# Patient Record
Sex: Female | Born: 1965 | Race: White | Hispanic: No | Marital: Married | Smoking: Never smoker
Health system: Southern US, Community
[De-identification: ages and names within clinical notes are randomized; demographics above are authoritative.]

## PROBLEM LIST (undated history)

## (undated) DIAGNOSIS — E785 Hyperlipidemia, unspecified: Secondary | ICD-10-CM

## (undated) DIAGNOSIS — R51 Headache: Secondary | ICD-10-CM

## (undated) DIAGNOSIS — R519 Headache, unspecified: Secondary | ICD-10-CM

## (undated) DIAGNOSIS — U071 COVID-19: Secondary | ICD-10-CM

## (undated) DIAGNOSIS — E039 Hypothyroidism, unspecified: Secondary | ICD-10-CM

## (undated) HISTORY — PX: CHOLECYSTECTOMY: SHX55

## (undated) HISTORY — DX: Morbid (severe) obesity due to excess calories: E66.01

## (undated) HISTORY — DX: Hyperlipidemia, unspecified: E78.5

## (undated) HISTORY — PX: TUBAL LIGATION: SHX77

## (undated) HISTORY — DX: COVID-19: U07.1

---

## 2006-05-25 ENCOUNTER — Ambulatory Visit (HOSPITAL_COMMUNITY): Admission: RE | Admit: 2006-05-25 | Discharge: 2006-05-25 | Payer: Self-pay | Admitting: Family Medicine

## 2017-05-25 ENCOUNTER — Encounter: Payer: Self-pay | Admitting: Medical

## 2017-08-05 ENCOUNTER — Other Ambulatory Visit: Payer: Self-pay | Admitting: Orthopedic Surgery

## 2017-08-10 NOTE — Pre-Procedure Instructions (Signed)
Loyal JacobsonGail D Dassow  08/10/2017      [facility] [address] - [place], [place] - [id number] [place] [id number] Phone: [phone] Fax: [phone]    Your procedure is scheduled on August 13, 2017.  [facility] at 530 AM.  Call this number if you have problems the morning of surgery:  [phone]   Remember:  Do not eat food or drink liquids after midnight.  Take these medicines the morning of surgery with A SIP OF WATER nadolol (corgard), levothyroxine (synthroid).   STOP taking any Aspirin (unless otherwise instructed by your surgeon), Aleve, Naproxen, Ibuprofen, Motrin, Advil, Goody's, BC's, all herbal medications, fish oil, and all vitamins  Continue all other medications as instructed by your physician except follow the above medication instructions before surgery   Do not wear jewelry, make-up or nail polish.  Do not wear lotions, powders, or perfumes, or deoderant.  Do not shave 48 hours prior to surgery.   Do not bring valuables to the hospital.  [facility] is not responsible for any belongings or valuables.  Contacts, dentures or bridgework may not be worn into surgery.  Leave your suitcase in the car.  After surgery it may be brought to your room.  For patients admitted to the hospital, discharge time will be determined by your treatment team.  Patients discharged the day of surgery will not be allowed to drive home.   Special instructions:  [facility]- Preparing For Surgery  Before surgery, you can play an important role. Because skin is not sterile, your skin needs to be as free of germs as possible. You can reduce the number of germs on your skin by washing with CHG (chlorahexidine gluconate) Soap before surgery.  CHG is an antiseptic cleaner which kills germs and bonds with the skin to continue killing germs even after washing.  Please do not use if you have an allergy to CHG or antibacterial soaps.  If your skin becomes reddened/irritated stop using the CHG.  Do not shave (including legs and underarms) for at least 48 hours prior to first CHG shower. It is OK to shave your face.  Please follow these instructions carefully.   1. Shower the NIGHT BEFORE SURGERY and the MORNING OF SURGERY with CHG.   2. If you chose to wash your hair, wash your hair first as usual with your normal shampoo.  3. After you shampoo, rinse your hair and body thoroughly to remove the shampoo.  4. Use CHG as you would any other liquid soap. You can apply CHG directly to the skin and wash gently with a scrungie or a clean washcloth.   5. Apply the CHG Soap to your body ONLY FROM THE NECK DOWN.  Do not use on open wounds or open sores. Avoid contact with your eyes, ears, mouth and genitals (private parts). Wash Face and genitals (private parts)  with your normal soap.  6. Wash thoroughly, paying special attention to the area where your surgery will be performed.  7. Thoroughly rinse your body with warm water from the neck down.  8. DO NOT shower/wash with your normal soap after using and rinsing off the CHG Soap.  9. Pat yourself dry with a CLEAN TOWEL.  10. Wear CLEAN PAJAMAS to bed the night before surgery, wear comfortable clothes the morning of surgery  11. Place CLEAN SHEETS on your bed the night of your first shower and DO NOT SLEEP  WITH PETS.    Day of Surgery: Do not apply any deodorants/lotions. Please wear clean clothes to the hospital/surgery center.     Please read over the following fact sheets that you were given. Pain Booklet, Coughing and Deep Breathing and Surgical Site Infection Prevention

## 2017-08-11 ENCOUNTER — Encounter (HOSPITAL_COMMUNITY)
Admission: RE | Admit: 2017-08-11 | Discharge: 2017-08-11 | Disposition: A | Discharge status: Home / Self Care | Payer: No Typology Code available for payment source | Source: Ambulatory Visit | Attending: Orthopedic Surgery | Admitting: Orthopedic Surgery

## 2017-08-11 ENCOUNTER — Encounter (HOSPITAL_COMMUNITY): Payer: Self-pay

## 2017-08-11 ENCOUNTER — Other Ambulatory Visit: Payer: Self-pay

## 2017-08-11 DIAGNOSIS — M23222 Derangement of posterior horn of medial meniscus due to old tear or injury, left knee: Secondary | ICD-10-CM | POA: Diagnosis not present

## 2017-08-11 DIAGNOSIS — E039 Hypothyroidism, unspecified: Secondary | ICD-10-CM | POA: Diagnosis not present

## 2017-08-11 DIAGNOSIS — M94262 Chondromalacia, left knee: Secondary | ICD-10-CM | POA: Diagnosis not present

## 2017-08-11 HISTORY — DX: Headache: R51

## 2017-08-11 HISTORY — DX: Headache, unspecified: R51.9

## 2017-08-11 HISTORY — DX: Hypothyroidism, unspecified: E03.9

## 2017-08-11 LAB — CBC
HCT: 42.1 % (ref 36.0–46.0)
Hemoglobin: 14.1 g/dL (ref 12.0–15.0)
MCH: 29.3 pg (ref 26.0–34.0)
MCHC: 33.5 g/dL (ref 30.0–36.0)
MCV: 87.5 fL (ref 78.0–100.0)
PLATELETS: 316 10*3/uL (ref 150–400)
RBC: 4.81 MIL/uL (ref 3.87–5.11)
RDW: 13.4 % (ref 11.5–15.5)
WBC: 7.2 10*3/uL (ref 4.0–10.5)

## 2017-08-11 LAB — BASIC METABOLIC PANEL
ANION GAP: 5 (ref 5–15)
BUN: 15 mg/dL (ref 6–20)
CALCIUM: 9.2 mg/dL (ref 8.9–10.3)
CO2: 26 mmol/L (ref 22–32)
Chloride: 108 mmol/L (ref 101–111)
Creatinine, Ser: 1.06 mg/dL — ABNORMAL HIGH (ref 0.44–1.00)
GFR, EST NON AFRICAN AMERICAN: 60 mL/min — AB (ref >60)
GLUCOSE: 96 mg/dL (ref 65–99)
POTASSIUM: 5 mmol/L (ref 3.5–5.1)
SODIUM: 139 mmol/L (ref 135–145)

## 2017-08-12 MED ORDER — CEFAZOLIN SODIUM-DEXTROSE 2-4 GM/100ML-% IV SOLN
2.0000 g | INTRAVENOUS | Status: AC
Start: 2017-08-13 — End: 2017-08-13
  Administered 2017-08-13: 2 g via INTRAVENOUS
  Filled 2017-08-12: qty 100

## 2017-08-13 ENCOUNTER — Encounter (HOSPITAL_COMMUNITY)
Admission: RE | Disposition: A | Discharge status: Home / Self Care | Payer: Self-pay | Source: Ambulatory Visit | Attending: Orthopedic Surgery

## 2017-08-13 ENCOUNTER — Ambulatory Visit (HOSPITAL_COMMUNITY): Payer: No Typology Code available for payment source | Admitting: Certified Registered"

## 2017-08-13 ENCOUNTER — Encounter (HOSPITAL_COMMUNITY): Payer: Self-pay | Admitting: *Deleted

## 2017-08-13 ENCOUNTER — Ambulatory Visit (HOSPITAL_COMMUNITY)
Admission: RE | Admit: 2017-08-13 | Discharge: 2017-08-13 | Disposition: A | Discharge status: Home / Self Care | Payer: No Typology Code available for payment source | Source: Ambulatory Visit | Attending: Orthopedic Surgery | Admitting: Orthopedic Surgery

## 2017-08-13 DIAGNOSIS — M94262 Chondromalacia, left knee: Secondary | ICD-10-CM | POA: Insufficient documentation

## 2017-08-13 DIAGNOSIS — M6752 Plica syndrome, left knee: Secondary | ICD-10-CM | POA: Diagnosis present

## 2017-08-13 DIAGNOSIS — M23222 Derangement of posterior horn of medial meniscus due to old tear or injury, left knee: Secondary | ICD-10-CM | POA: Diagnosis not present

## 2017-08-13 DIAGNOSIS — S83242A Other tear of medial meniscus, current injury, left knee, initial encounter: Secondary | ICD-10-CM | POA: Diagnosis present

## 2017-08-13 DIAGNOSIS — E039 Hypothyroidism, unspecified: Secondary | ICD-10-CM | POA: Insufficient documentation

## 2017-08-13 HISTORY — PX: KNEE ARTHROSCOPY: SHX127

## 2017-08-13 SURGERY — ARTHROSCOPY, KNEE
Anesthesia: General | Laterality: Left

## 2017-08-13 MED ORDER — LACTATED RINGERS IV SOLN
INTRAVENOUS | Status: DC | PRN
  Administered 2017-08-13 (×2): via INTRAVENOUS

## 2017-08-13 MED ORDER — HYDROCODONE-ACETAMINOPHEN 5-325 MG PO TABS: 1.0000 | ORAL_TABLET | Freq: Four times a day (QID) | ORAL | 0 refills | Status: DC | PRN

## 2017-08-13 MED ORDER — BUPIVACAINE HCL (PF) 0.25 % IJ SOLN
INTRAMUSCULAR | Status: DC | PRN
  Administered 2017-08-13: 20 mL

## 2017-08-13 MED ORDER — LIDOCAINE HCL (CARDIAC) 20 MG/ML IV SOLN
INTRAVENOUS | Status: DC | PRN
  Administered 2017-08-13: 100 mg via INTRAVENOUS

## 2017-08-13 MED ORDER — ONDANSETRON HCL 4 MG/2ML IJ SOLN
INTRAMUSCULAR | Status: DC | PRN
  Administered 2017-08-13: 4 mg via INTRAVENOUS

## 2017-08-13 MED ORDER — MIDAZOLAM HCL 2 MG/2ML IJ SOLN
INTRAMUSCULAR | Status: AC
  Filled 2017-08-13: qty 2

## 2017-08-13 MED ORDER — FENTANYL CITRATE (PF) 250 MCG/5ML IJ SOLN
INTRAMUSCULAR | Status: AC
  Filled 2017-08-13: qty 5

## 2017-08-13 MED ORDER — PROPOFOL 10 MG/ML IV BOLUS
INTRAVENOUS | Status: DC | PRN
  Administered 2017-08-13: 200 mg via INTRAVENOUS

## 2017-08-13 MED ORDER — EPHEDRINE SULFATE 50 MG/ML IJ SOLN
INTRAMUSCULAR | Status: DC | PRN
Start: 2017-08-13 — End: 2017-08-13
  Administered 2017-08-13 (×2): 10 mg via INTRAVENOUS

## 2017-08-13 MED ORDER — CHLORHEXIDINE GLUCONATE 4 % EX LIQD: 60.0000 mL | Freq: Once | CUTANEOUS | Status: DC

## 2017-08-13 MED ORDER — BUPIVACAINE HCL (PF) 0.25 % IJ SOLN
INTRAMUSCULAR | Status: AC
  Filled 2017-08-13: qty 30

## 2017-08-13 MED ORDER — ROCURONIUM BROMIDE 10 MG/ML (PF) SYRINGE
PREFILLED_SYRINGE | INTRAVENOUS | Status: AC
  Filled 2017-08-13: qty 5

## 2017-08-13 MED ORDER — SUCCINYLCHOLINE CHLORIDE 200 MG/10ML IV SOSY
PREFILLED_SYRINGE | INTRAVENOUS | Status: AC
  Filled 2017-08-13: qty 10

## 2017-08-13 MED ORDER — EPINEPHRINE PF 1 MG/ML IJ SOLN
INTRAMUSCULAR | Status: AC
  Filled 2017-08-13: qty 4

## 2017-08-13 MED ORDER — EPINEPHRINE PF 1 MG/ML IJ SOLN
INTRAMUSCULAR | Status: DC | PRN
  Administered 2017-08-13: 1 mg

## 2017-08-13 MED ORDER — SODIUM CHLORIDE 0.9 % IR SOLN
Status: DC | PRN
  Administered 2017-08-13: 3000 mL

## 2017-08-13 MED ORDER — PROPOFOL 10 MG/ML IV BOLUS
INTRAVENOUS | Status: AC
  Filled 2017-08-13: qty 20

## 2017-08-13 MED ORDER — LIDOCAINE 2% (20 MG/ML) 5 ML SYRINGE
INTRAMUSCULAR | Status: AC
  Filled 2017-08-13: qty 5

## 2017-08-13 MED ORDER — FENTANYL CITRATE (PF) 100 MCG/2ML IJ SOLN
INTRAMUSCULAR | Status: DC | PRN
  Administered 2017-08-13 (×5): 25 ug via INTRAVENOUS

## 2017-08-13 MED ORDER — DEXAMETHASONE SODIUM PHOSPHATE 10 MG/ML IJ SOLN
INTRAMUSCULAR | Status: DC | PRN
  Administered 2017-08-13: 10 mg via INTRAVENOUS

## 2017-08-13 MED ORDER — MIDAZOLAM HCL 5 MG/5ML IJ SOLN
INTRAMUSCULAR | Status: DC | PRN
  Administered 2017-08-13: 2 mg via INTRAVENOUS

## 2017-08-13 SURGICAL SUPPLY — 38 items
BANDAGE ACE 6X5 VEL STRL LF (GAUZE/BANDAGES/DRESSINGS) ×2 IMPLANT
BLADE CUDA 5.5 (BLADE) IMPLANT
BLADE CUTTER GATOR 3.5 (BLADE) IMPLANT
BLADE GREAT WHITE 4.2 (BLADE) ×2 IMPLANT
BNDG GAUZE ELAST 4 BULKY (GAUZE/BANDAGES/DRESSINGS) ×2 IMPLANT
CUFF TOURNIQUET SINGLE 34IN LL (TOURNIQUET CUFF) IMPLANT
CUFF TOURNIQUET SINGLE 44IN (TOURNIQUET CUFF) IMPLANT
DRAPE ARTHROSCOPY W/POUCH 114 (DRAPES) ×2 IMPLANT
DRAPE HALF SHEET 40X57 (DRAPES) ×2 IMPLANT
DRAPE U-SHAPE 47X51 STRL (DRAPES) ×2 IMPLANT
DRSG EMULSION OIL 3X3 NADH (GAUZE/BANDAGES/DRESSINGS) ×2 IMPLANT
DRSG PAD ABDOMINAL 8X10 ST (GAUZE/BANDAGES/DRESSINGS) ×2 IMPLANT
DURAPREP 26ML APPLICATOR (WOUND CARE) ×2 IMPLANT
FILTER STRAW FLUID ASPIR (MISCELLANEOUS) ×2 IMPLANT
GAUZE SPONGE 4X4 12PLY STRL (GAUZE/BANDAGES/DRESSINGS) ×2 IMPLANT
GLOVE BIOGEL PI IND STRL 8 (GLOVE) ×2 IMPLANT
GLOVE BIOGEL PI INDICATOR 8 (GLOVE) ×2
GLOVE ECLIPSE 7.5 STRL STRAW (GLOVE) ×4 IMPLANT
GOWN STRL REUS W/ TWL LRG LVL3 (GOWN DISPOSABLE) ×1 IMPLANT
GOWN STRL REUS W/ TWL XL LVL3 (GOWN DISPOSABLE) ×2 IMPLANT
GOWN STRL REUS W/TWL LRG LVL3 (GOWN DISPOSABLE) ×2
GOWN STRL REUS W/TWL XL LVL3 (GOWN DISPOSABLE) ×4
KIT BASIN OR (CUSTOM PROCEDURE TRAY) ×2 IMPLANT
KIT ROOM TURNOVER OR (KITS) ×2 IMPLANT
NDL 18GX1X1/2 (RX/OR ONLY) (NEEDLE) ×1 IMPLANT
NEEDLE 18GX1X1/2 (RX/OR ONLY) (NEEDLE) ×2 IMPLANT
PACK ARTHROSCOPY DSU (CUSTOM PROCEDURE TRAY) ×2 IMPLANT
PAD ARMBOARD 7.5X6 YLW CONV (MISCELLANEOUS) ×4 IMPLANT
PAD CAST 4YDX4 CTTN HI CHSV (CAST SUPPLIES) ×2 IMPLANT
PADDING CAST COTTON 4X4 STRL (CAST SUPPLIES) ×4
SET ARTHROSCOPY TUBING (MISCELLANEOUS) ×2
SET ARTHROSCOPY TUBING LN (MISCELLANEOUS) ×1 IMPLANT
SPONGE LAP 4X18 X RAY DECT (DISPOSABLE) ×1 IMPLANT
SUT ETHILON 4 0 PS 2 18 (SUTURE) ×2 IMPLANT
SYR 5ML LL (SYRINGE) ×2 IMPLANT
TOWEL OR 17X24 6PK STRL BLUE (TOWEL DISPOSABLE) ×2 IMPLANT
TOWEL OR 17X26 10 PK STRL BLUE (TOWEL DISPOSABLE) ×2 IMPLANT
WRAP KNEE MAXI GEL POST OP (GAUZE/BANDAGES/DRESSINGS) ×2 IMPLANT

## 2017-08-13 NOTE — H&P (Signed)
PREOPERATIVE H&P  Chief Complaint: l knee pain  HPI: Taylor JacobsonGail D Ho is a 51 y.o. female who presents for evaluation of l knee pain. It has been present for greater than 3 months and has been worsening. She has failed conservative measures. Pain is rated as moderate.  Past Medical History:  Diagnosis Date  . Headache   . Hypothyroidism    Past Surgical History:  Procedure Laterality Date  . CHOLECYSTECTOMY    . TUBAL LIGATION     Social History   Socioeconomic History  . Marital status: Married    Spouse name: None  . Number of children: None  . Years of education: None  . Highest education level: None  Social Needs  . Financial resource strain: None  . Food insecurity - worry: None  . Food insecurity - inability: None  . Transportation needs - medical: None  . Transportation needs - non-medical: None  Occupational History  . None  Tobacco Use  . Smoking status: Never Smoker  . Smokeless tobacco: Never Used  Substance and Sexual Activity  . Alcohol use: No    Frequency: Never  . Drug use: No  . Sexual activity: None  Other Topics Concern  . None  Social History Narrative  . None   History reviewed. No pertinent family history. No Known Allergies Prior to Admission medications   Medication Sig Start Date End Date Taking? Authorizing Provider  levothyroxine (SYNTHROID, LEVOTHROID) 100 MCG tablet Take 100 mcg daily before breakfast by mouth.   Yes [provider]  nadolol (CORGARD) 80 MG tablet Take 80 mg daily by mouth.   Yes [provider]  SUMAtriptan (IMITREX) 100 MG tablet Take 100 mg every 2 (two) hours as needed by mouth for migraine. May repeat in 2 hours if headache persists or recurs.   Yes [provider]     Positive ROS: none  All other systems have been reviewed and were otherwise negative with the exception of those mentioned in the HPI and as above.  Physical Exam: Vitals:   08/13/17 0545  BP: (!) 144/86  Pulse: 64   Resp: 20  Temp: 98.7 F (37.1 C)  SpO2: 98%    General: Alert, no acute distress Cardiovascular: No pedal edema Respiratory: No cyanosis, no use of accessory musculature GI: No organomegaly, abdomen is soft and non-tender Skin: No lesions in the area of chief complaint Neurologic: Sensation intact distally Psychiatric: Patient is competent for consent with normal mood and affect Lymphatic: No axillary or cervical lymphadenopathy  MUSCULOSKELETAL: l knee: +med jt line tender/ -instability, trace effusion  Assessment/Plan: LEFT KNEE MEDIAL MENISCUS TEAR Plan for Procedure(s): ARTHROSCOPY KNEE  The risks benefits and alternatives were discussed with the patient including but not limited to the risks of nonoperative treatment, versus surgical intervention including infection, bleeding, nerve injury, malunion, nonunion, hardware prominence, hardware failure, need for hardware removal, blood clots, cardiopulmonary complications, morbidity, mortality, among others, and they were willing to proceed.  Predicted outcome is good, although there will be at least a six to nine month expected recovery.  Harvie JuniorJohn L Antia Rahal, MD 08/13/2017 7:06 AM

## 2017-08-13 NOTE — Discharge Instructions (Signed)
POST-OP KNEE ARTHROSCOPY INSTRUCTIONS  °Dr. John Graves/Jim Deloris Mittag PA-C ° °Pain °You will be expected to have a moderate amount of pain in the affected knee for approximately two weeks. However, the first two days will be the most severe pain. A prescription has been provided to take as needed for the pain. The pain can be reduced by applying ice packs to the knee for the first 1-2 weeks post surgery. Also, keeping the leg elevated on pillows will help alleviate the pain. If you develop any acute pain or swelling in your calf muscle, please call the doctor. ° °Activity °It is preferred that you stay at bed rest for approximately 24 hours. However, you may go to the bathroom with help. Weight bearing as tolerated. You may begin the knee exercises the day of surgery. Discontinue crutches as the knee pain resolves. ° °Dressing °Keep the dressing dry. If the ace bandage should wrinkle or roll up, this can be rewrapped to prevent ridges in the bandage. You may remove all dressings in 48 hours,  apply bandaids to each wound. You may shower on the 4th day after surgery but no tub bath. ° °Symptoms to report to your doctor °Extreme pain °Extreme swelling °Temperature above 101 degrees °Change in the feeling, color, or movement of your toes °Redness, heat, or swelling at your incision ° °Exercise °If is preferred that as soon as possible you try to do a straight leg raise without bending the knee and concentrate on bringing the heel of your foot off the bed up to approximately 45 degrees and hold for the count of 10 seconds. Repeat this at least 10 times three or four times per day. Additional exercises are provided below. ° °You are encouraged to bend the knee as tolerated. ° °Follow-Up °Call to schedule a follow-up appointment in 5-7 days. Our office # is [phone]. ° °POST-OP EXERCISES ° °Short Arc Quads ° °1. Lie on back with legs straight. Place towel roll under thigh, just above knee. °2. Tighten thigh muscles to  straighten knee and lift heel off bed. °3. Hold for slow count of five, then lower. °4. Do three sets of ten ° ° ° °Straight Leg Raises ° °1. Lie on back with operative leg straight and other leg bent. °2. Keeping operative leg completely straight, slowly lift operative leg so foot is 5 inches off bed. °3. Hold for slow count of five, then lower. °4. Do three sets of ten. ° ° ° °DO BOTH EXERCISES 2 TIMES A DAY ° °Ankle Pumps ° °Work/move the operative ankle and foot up and down 10 times every hour while awake. °

## 2017-08-13 NOTE — Anesthesia Postprocedure Evaluation (Signed)
Anesthesia Post Note  Patient: Taylor Ho  Procedure(s) Performed: ARTHROSCOPY KNEE (Left )     Patient location during evaluation: PACU Anesthesia Type: General Level of consciousness: awake Pain management: pain level controlled Respiratory status: spontaneous breathing Cardiovascular status: stable Anesthetic complications: no    Last Vitals:  Vitals:   08/13/17 0830 08/13/17 0835  BP: 109/78 139/79  Pulse: 65 66  Resp: 15 16  Temp: (!) 36.3 C (!) 36.3 C  SpO2: 100% 99%    Last Pain:  Vitals:   08/13/17 0835  TempSrc:   PainSc: 4                  Era Parr

## 2017-08-13 NOTE — Anesthesia Procedure Notes (Signed)
Procedure Name: LMA Insertion Date/Time: 08/13/2017 7:22 AM Performed by: Rosiland OzMeyers, Peighton Mehra, CRNA Pre-anesthesia Checklist: Patient identified, Emergency Drugs available, Patient being monitored, Timeout performed and Suction available Patient Re-evaluated:Patient Re-evaluated prior to induction Oxygen Delivery Method: Circle system utilized Preoxygenation: Pre-oxygenation with 100% oxygen Induction Type: IV induction LMA: LMA inserted LMA Size: 4.0 Number of attempts: 1 Placement Confirmation: positive ETCO2 and breath sounds checked- equal and bilateral Tube secured with: Tape Dental Injury: Teeth and Oropharynx as per pre-operative assessment

## 2017-08-13 NOTE — Progress Notes (Signed)
Pt arrived to pacu with iv infusing 

## 2017-08-13 NOTE — Brief Op Note (Signed)
08/13/2017  7:53 AM  PATIENT:  Taylor Ho  51 y.o. female  PRE-OPERATIVE DIAGNOSIS:  LEFT KNEE MEDIAL MENISCUS TEAR  POST-OPERATIVE DIAGNOSIS:  LEFT KNEE MEDIAL MENISCUS TEAR  PROCEDURE:  Procedure(s): ARTHROSCOPY KNEE (Left)  SURGEON:  Surgeon(s) and Role:    Jodi Geralds* Kent Riendeau, MD - Primary  PHYSICIAN ASSISTANT:   ASSISTANTS: bethune   ANESTHESIA:   general  EBL:  none  BLOOD ADMINISTERED:none  DRAINS: none   LOCAL MEDICATIONS USED:  MARCAINE     SPECIMEN:  No Specimen  DISPOSITION OF SPECIMEN:  N/A  COUNTS:  YES  TOURNIQUET:  * No tourniquets in log *  DICTATION: .Other Dictation: Dictation Number [id number]  PLAN OF CARE: Discharge to home after PACU  PATIENT DISPOSITION:  PACU - hemodynamically stable.   Delay start of Pharmacological VTE agent (>24hrs) due to surgical blood loss or risk of bleeding: no

## 2017-08-13 NOTE — Op Note (Signed)
NAME:  [name], [name]                      ACCOUNT NO.:  MEDICAL RECORD NO.:  [id number]  LOCATION:                                 FACILITY:  PHYSICIAN:  Harvie JuniorJohn L. Saraia Platner, M.D.        DATE OF BIRTH:  DATE OF PROCEDURE:  08/13/2017 DATE OF DISCHARGE:                              OPERATIVE REPORT   PREOPERATIVE DIAGNOSIS:  Medial meniscal tear.  POSTOPERATIVE DIAGNOSES: 1. Medial meniscal tear. 2. 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 3. Medial shelf plica.  PROCEDURES: 1. Partial medial meniscectomy. 2. Chondroplasty medial femoral condyle and patellofemoral joint down     to bleeding bone when necessary. 3. Medial shelf plica excision.  SURGEON:  Harvie JuniorJohn L. Lavon Bothwell, MD.  ASSISTANT:  Marshia LyJames Bethune, PA.  ANESTHESIA:  General.  BRIEF HISTORY:  [name] is a 51 year old female with long history and significant complaints of left knee pain.  She had been treated conservatively for a period of time.  After failure of conservative care, an MRI was obtained which showed that she had a posterior horn medial meniscal tear at the root.  We talked about treatment options. We felt that resection was appropriate after failure of conservative care.  She was taken to the operating room for this procedure.  DESCRIPTION OF PROCEDURE:  The patient was taken to the operating room and after adequate anesthesia was obtained with general anesthetic, the patient was placed supine on the operating table.  The left leg was prepped and draped in usual sterile fashion.  Following this, routine arthroscopic examination was performed, which showed that there was a significant posterior horn medial meniscal tear at the meniscal root. It really tracked back almost to the root.  At this point, the meniscus was debrided back to a smooth and stable rim.  Once that was completed, attention was turned to the medial femoral condyle where there was some chondromalacia which was  debrided.  Attention was turned towards the central portion of the knee with ACL and PCL noted to be normal. Attention was turned laterally where the lateral meniscus and lateral femoral condyle were normal.  Attention was turned back medially where chondromalacia of the medial femoral condyle was debrided.  It was grade 2 and some small areas of grade 3 in nature and these were debrided. Attention was turned back up to the patellofemoral joint where there was some grade 2 chondromalacia which was debrided and at this point, the knee was copiously and thoroughly lavaged with irrigation and suctioned dry.  The arthroscopic portals were closed with a bandage.  A sterile compressive dressing was applied after 20 mL of one quarter percent Marcaine was instilled throughout the knee for postoperative anesthesia.  At this point, the patient was taken to the recovery room where she was noted to be in satisfactory condition.  Estimated blood loss for the procedure was minimal.     Harvie JuniorJohn L. Salome Hautala, M.D.     Ranae PlumberJLG/MEDQ  D:  08/13/2017  T:  08/13/2017  Job:  [id number]

## 2017-08-13 NOTE — Anesthesia Preprocedure Evaluation (Signed)
Anesthesia Evaluation  Patient identified by MRN, date of birth, ID band Patient awake    Reviewed: Allergy & Precautions, NPO status , Patient's Chart, lab work & pertinent test results  Airway Mallampati: II  TM Distance: >3 FB     Dental   Pulmonary    breath sounds clear to auscultation       Cardiovascular negative cardio ROS   Rhythm:Regular Rate:Normal     Neuro/Psych  Headaches,    GI/Hepatic negative GI ROS, Neg liver ROS,   Endo/Other  Hypothyroidism   Renal/GU negative Renal ROS     Musculoskeletal   Abdominal   Peds  Hematology   Anesthesia Other Findings   Reproductive/Obstetrics                             Anesthesia Physical Anesthesia Plan  ASA: II  Anesthesia Plan: General   Post-op Pain Management:    Induction: Intravenous  PONV Risk Score and Plan: 4 or greater and Treatment may vary due to age or medical condition, Ondansetron, Dexamethasone, Midazolam and Scopolamine patch - Pre-op  Airway Management Planned: LMA  Additional Equipment:   Intra-op Plan:   Post-operative Plan: Extubation in OR  Informed Consent: I have reviewed the patients History and Physical, chart, labs and discussed the procedure including the risks, benefits and alternatives for the proposed anesthesia with the patient or authorized representative who has indicated his/her understanding and acceptance.   Dental advisory given  Plan Discussed with: CRNA and Anesthesiologist  Anesthesia Plan Comments:         Anesthesia Quick Evaluation

## 2017-08-13 NOTE — Progress Notes (Signed)
Orthopedic Tech Progress Note Patient Details:  Taylor JacobsonGail D Ho 1965-10-15 [id number]  Ortho Devices Type of Ortho Device: Crutches Ortho Device/Splint Interventions: Application   Nikki DomCrawford, [name] 08/13/2017, 8:52 AM Viewed order from doctor's order list

## 2017-08-13 NOTE — Transfer of Care (Signed)
Immediate Anesthesia Transfer of Care Note  Patient: Taylor Ho  Procedure(s) Performed: ARTHROSCOPY KNEE (Left )  Patient Location: PACU  Anesthesia Type:General  Level of Consciousness: awake, alert  and patient cooperative  Airway & Oxygen Therapy: Patient Spontanous Breathing  Post-op Assessment: Report given to RN and Post -op Vital signs reviewed and stable  Post vital signs: Reviewed and stable  Last Vitals:  Vitals:   08/13/17 0545  BP: (!) 144/86  Pulse: 64  Resp: 20  Temp: 37.1 C  SpO2: 98%    Last Pain:  Vitals:   08/13/17 0545  TempSrc: Oral         Complications: No apparent anesthesia complications

## 2017-08-14 ENCOUNTER — Encounter (HOSPITAL_COMMUNITY): Payer: Self-pay | Admitting: Orthopedic Surgery

## 2017-10-22 DIAGNOSIS — Z6836 Body mass index (BMI) 36.0-36.9, adult: Secondary | ICD-10-CM | POA: Diagnosis not present

## 2017-10-22 DIAGNOSIS — Z0000 Encounter for general adult medical examination without abnormal findings: Secondary | ICD-10-CM | POA: Diagnosis not present

## 2017-10-22 DIAGNOSIS — Z1231 Encounter for screening mammogram for malignant neoplasm of breast: Secondary | ICD-10-CM | POA: Diagnosis not present

## 2017-10-22 DIAGNOSIS — Z1211 Encounter for screening for malignant neoplasm of colon: Secondary | ICD-10-CM | POA: Diagnosis not present

## 2017-10-22 DIAGNOSIS — E034 Atrophy of thyroid (acquired): Secondary | ICD-10-CM | POA: Diagnosis not present

## 2017-10-22 DIAGNOSIS — E782 Mixed hyperlipidemia: Secondary | ICD-10-CM | POA: Diagnosis not present

## 2017-12-01 DIAGNOSIS — N3001 Acute cystitis with hematuria: Secondary | ICD-10-CM | POA: Diagnosis not present

## 2017-12-02 ENCOUNTER — Encounter: Payer: Self-pay | Admitting: Medical

## 2018-05-27 DIAGNOSIS — N3001 Acute cystitis with hematuria: Secondary | ICD-10-CM | POA: Diagnosis not present

## 2018-05-27 DIAGNOSIS — E034 Atrophy of thyroid (acquired): Secondary | ICD-10-CM | POA: Diagnosis not present

## 2018-05-27 DIAGNOSIS — G43009 Migraine without aura, not intractable, without status migrainosus: Secondary | ICD-10-CM | POA: Diagnosis not present

## 2018-05-27 DIAGNOSIS — E782 Mixed hyperlipidemia: Secondary | ICD-10-CM | POA: Diagnosis not present

## 2018-06-02 ENCOUNTER — Encounter: Payer: Self-pay | Admitting: Gastroenterology

## 2018-07-06 ENCOUNTER — Ambulatory Visit (AMBULATORY_SURGERY_CENTER): Payer: Self-pay

## 2018-07-06 VITALS — Ht 66.0 in | Wt 219.8 lb

## 2018-07-06 DIAGNOSIS — Z800 Family history of malignant neoplasm of digestive organs: Secondary | ICD-10-CM

## 2018-07-06 MED ORDER — MOVIPREP 100 G PO SOLR: 1.0000 | Freq: Once | ORAL | 0 refills | Status: AC

## 2018-07-06 NOTE — Progress Notes (Signed)
Per pt, no allergies to soy or egg products.Pt not taking any weight loss meds or using  O2 at home.  Pt refused emmi video. 

## 2018-07-07 ENCOUNTER — Encounter: Payer: Self-pay | Admitting: Gastroenterology

## 2018-07-12 DIAGNOSIS — Z1231 Encounter for screening mammogram for malignant neoplasm of breast: Secondary | ICD-10-CM | POA: Diagnosis not present

## 2018-07-12 LAB — HM MAMMOGRAPHY: HM Mammogram: NORMAL (ref 0–4)

## 2018-07-14 ENCOUNTER — Telehealth: Payer: Self-pay | Admitting: Gastroenterology

## 2018-07-14 MED ORDER — NA SULFATE-K SULFATE-MG SULF 17.5-3.13-1.6 GM/177ML PO SOLN: 1.0000 | Freq: Once | ORAL | 0 refills | Status: AC

## 2018-07-14 NOTE — Telephone Encounter (Signed)
A Script for Suprep was e prescribed to the [facility] in [place] and new Suprep instructions were sent via MyChart   I called and left a detailed message on the pt's voice mail that identifies the pt by first and last name  That the new script was called in and new instructions are in My Chart-  I explained to her that she would not get the new instrctions in the mail by Tuesday 10-22 and she can always come to the [facility] and pick up the printed instructions, to call and let me know and I'll print them for her as well    Hilda Lias PV

## 2018-07-14 NOTE — Telephone Encounter (Signed)
Pt called to inform that moviprep is not covered by her insurance. Request a different prep.

## 2018-07-20 ENCOUNTER — Ambulatory Visit (AMBULATORY_SURGERY_CENTER): Payer: BLUE CROSS/BLUE SHIELD | Admitting: Gastroenterology

## 2018-07-20 ENCOUNTER — Encounter: Payer: Self-pay | Admitting: Gastroenterology

## 2018-07-20 VITALS — BP 99/63 | HR 60 | Temp 97.3°F | Resp 14 | Ht 66.0 in | Wt 219.0 lb

## 2018-07-20 DIAGNOSIS — Z1211 Encounter for screening for malignant neoplasm of colon: Secondary | ICD-10-CM | POA: Diagnosis not present

## 2018-07-20 DIAGNOSIS — Z800 Family history of malignant neoplasm of digestive organs: Secondary | ICD-10-CM

## 2018-07-20 LAB — HM COLONOSCOPY

## 2018-07-20 MED ORDER — SODIUM CHLORIDE 0.9 % IV SOLN: 500.0000 mL | Freq: Once | INTRAVENOUS | Status: DC

## 2018-07-20 NOTE — Progress Notes (Signed)
PT taken to PACU. Monitors in place. VSS. Report given to RN. 

## 2018-07-20 NOTE — Op Note (Signed)
[facility] Patient Name: Taylor Ho Procedure Date: 07/20/2018 7:55 AM MRN: [id number] Endoscopist: Lynann Bologna , MD Age: 52 Referring MD:  Date of Birth: 1966-06-23 Gender: Female Account #: [id number] Procedure:                Colonoscopy Indications:              Screening patient at increased risk: Family history                            of colorectal cancer in multiple 1st-degree                            relatives (father, sister and cousin) Medicines:                Monitored Anesthesia Care Procedure:                Pre-Anesthesia Assessment:                           - Prior to the procedure, a History and Physical                            was performed, and patient medications and                            allergies were reviewed. The patient's tolerance of                            previous anesthesia was also reviewed. The risks                            and benefits of the procedure and the sedation                            options and risks were discussed with the patient.                            All questions were answered, and informed consent                            was obtained. Prior Anticoagulants: The patient has                            taken no previous anticoagulant or antiplatelet                            agents. ASA Grade Assessment: II - A patient with                            mild systemic disease. After reviewing the risks                            and benefits, the patient was deemed in  satisfactory condition to undergo the procedure.                           After obtaining informed consent, the colonoscope                            was passed under direct vision. Throughout the                            procedure, the patient's blood pressure, pulse, and                            oxygen saturations were monitored continuously. The                            Colonoscope was introduced  through the anus and                            advanced to the 2 cm into the ileum. The                            colonoscopy was performed without difficulty. The                            patient tolerated the procedure well. The quality                            of the bowel preparation was excellent. Scope In: 8:11:24 AM Scope Out: 8:20:35 AM Scope Withdrawal Time: 0 hours 6 minutes 10 seconds  Total Procedure Duration: 0 hours 9 minutes 11 seconds  Findings:                 Non-bleeding internal hemorrhoids were found during                            retroflexion. The hemorrhoids were small.                           The exam was otherwise without abnormality on                            direct and retroflexion views. Complications:            No immediate complications. Estimated Blood Loss:     Estimated blood loss: none. Impression:               - Non-bleeding internal hemorrhoids.                           - The examination was otherwise normal on direct                            and retroflexion views. Recommendation:           - Patient has a contact number available for  emergencies. The signs and symptoms of potential                            delayed complications were discussed with the                            patient. Return to normal activities tomorrow.                            Written discharge instructions were provided to the                            patient.                           - Resume previous diet.                           - Continue present medications.                           - Repeat colonoscopy in 3 years for screening                            purposes. Earlier, if with any problems.                           - Return to GI clinic PRN. Lynann Bologna, MD 07/20/2018 8:24:03 AM This report has been signed electronically.

## 2018-07-20 NOTE — Progress Notes (Signed)
Pt's states no medical or surgical changes since previsit or office visit. 

## 2018-07-20 NOTE — Patient Instructions (Signed)
Handouts given on hemorrhoids.   YOU HAD AN ENDOSCOPIC PROCEDURE TODAY AT THE [facility]:   Refer to the procedure report that was given to you for any specific questions about what was found during the examination.  If the procedure report does not answer your questions, please call your gastroenterologist to clarify.  If you requested that your care partner not be given the details of your procedure findings, then the procedure report has been included in a sealed envelope for you to review at your convenience later.  YOU SHOULD EXPECT: Some feelings of bloating in the abdomen. Passage of more gas than usual.  Walking can help get rid of the air that was put into your GI tract during the procedure and reduce the bloating. If you had a lower endoscopy (such as a colonoscopy or flexible sigmoidoscopy) you may notice spotting of blood in your stool or on the toilet paper. If you underwent a bowel prep for your procedure, you may not have a normal bowel movement for a few days.  Please Note:  You might notice some irritation and congestion in your nose or some drainage.  This is from the oxygen used during your procedure.  There is no need for concern and it should clear up in a day or so.  SYMPTOMS TO REPORT IMMEDIATELY:   Following lower endoscopy (colonoscopy or flexible sigmoidoscopy):  Excessive amounts of blood in the stool  Significant tenderness or worsening of abdominal pains  Swelling of the abdomen that is new, acute  Fever of 100F or higher   For urgent or emergent issues, a gastroenterologist can be reached at any hour by calling [phone].   DIET:  We do recommend a small meal at first, but then you may proceed to your regular diet.  Drink plenty of fluids but you should avoid alcoholic beverages for 24 hours.  ACTIVITY:  You should plan to take it easy for the rest of today and you should NOT DRIVE or use heavy machinery until tomorrow (because of the sedation  medicines used during the test).    FOLLOW UP: Our staff will call the number listed on your records the next business day following your procedure to check on you and address any questions or concerns that you may have regarding the information given to you following your procedure. If we do not reach you, we will leave a message.  However, if you are feeling well and you are not experiencing any problems, there is no need to return our call.  We will assume that you have returned to your regular daily activities without incident.  If any biopsies were taken you will be contacted by phone or by letter within the next 1-3 weeks.  Please call us at [phone] if you have not heard about the biopsies in 3 weeks.    SIGNATURES/CONFIDENTIALITY: You and/or your care partner have signed paperwork which will be entered into your electronic medical record.  These signatures attest to the fact that that the information above on your After Visit Summary has been reviewed and is understood.  Full responsibility of the confidentiality of this discharge information lies with you and/or your care-partner. 

## 2018-07-21 ENCOUNTER — Telehealth: Payer: Self-pay

## 2018-07-21 NOTE — Telephone Encounter (Signed)
Called [phone] and left a messaged we tried to reach pt for a follow up call. maw

## 2018-07-21 NOTE — Telephone Encounter (Signed)
  Follow up Call-  Call back number 07/20/2018  Post procedure Call Back phone  # [phone]  Permission to leave phone message Yes  Some recent data might be hidden     Patient questions:  Do you have a fever, pain , or abdominal swelling? No. Pain Score  0 *  Have you tolerated food without any problems? Yes.    Have you been able to return to your normal activities? Yes.    Do you have any questions about your discharge instructions: Diet   No. Medications  No. Follow up visit  No.  Do you have questions or concerns about your Care? No.  Actions: * If pain score is 4 or above: No action needed, pain <4.

## 2018-10-24 DIAGNOSIS — J060 Acute laryngopharyngitis: Secondary | ICD-10-CM | POA: Diagnosis not present

## 2019-03-10 DIAGNOSIS — E782 Mixed hyperlipidemia: Secondary | ICD-10-CM | POA: Diagnosis not present

## 2019-03-10 DIAGNOSIS — E034 Atrophy of thyroid (acquired): Secondary | ICD-10-CM | POA: Diagnosis not present

## 2019-03-10 DIAGNOSIS — G43009 Migraine without aura, not intractable, without status migrainosus: Secondary | ICD-10-CM | POA: Diagnosis not present

## 2019-03-10 DIAGNOSIS — M545 Low back pain: Secondary | ICD-10-CM | POA: Diagnosis not present

## 2019-10-01 DIAGNOSIS — U071 COVID-19: Secondary | ICD-10-CM | POA: Diagnosis not present

## 2019-10-01 DIAGNOSIS — Z79899 Other long term (current) drug therapy: Secondary | ICD-10-CM | POA: Diagnosis not present

## 2019-10-01 DIAGNOSIS — R918 Other nonspecific abnormal finding of lung field: Secondary | ICD-10-CM | POA: Diagnosis not present

## 2019-10-01 DIAGNOSIS — J1289 Other viral pneumonia: Secondary | ICD-10-CM | POA: Diagnosis not present

## 2019-10-01 DIAGNOSIS — R0602 Shortness of breath: Secondary | ICD-10-CM | POA: Diagnosis not present

## 2019-10-01 DIAGNOSIS — J9601 Acute respiratory failure with hypoxia: Secondary | ICD-10-CM | POA: Diagnosis not present

## 2019-10-01 DIAGNOSIS — I10 Essential (primary) hypertension: Secondary | ICD-10-CM | POA: Diagnosis not present

## 2019-10-01 DIAGNOSIS — E875 Hyperkalemia: Secondary | ICD-10-CM | POA: Diagnosis not present

## 2019-10-01 DIAGNOSIS — R1110 Vomiting, unspecified: Secondary | ICD-10-CM | POA: Diagnosis not present

## 2019-10-01 DIAGNOSIS — R0902 Hypoxemia: Secondary | ICD-10-CM | POA: Diagnosis not present

## 2019-10-01 DIAGNOSIS — N179 Acute kidney failure, unspecified: Secondary | ICD-10-CM | POA: Diagnosis not present

## 2019-10-01 DIAGNOSIS — E860 Dehydration: Secondary | ICD-10-CM | POA: Diagnosis not present

## 2019-10-01 DIAGNOSIS — R7401 Elevation of levels of liver transaminase levels: Secondary | ICD-10-CM | POA: Diagnosis not present

## 2019-10-01 DIAGNOSIS — E039 Hypothyroidism, unspecified: Secondary | ICD-10-CM | POA: Diagnosis not present

## 2019-10-02 ENCOUNTER — Other Ambulatory Visit: Payer: Self-pay

## 2019-10-02 ENCOUNTER — Encounter (HOSPITAL_COMMUNITY): Payer: Self-pay | Admitting: Internal Medicine

## 2019-10-02 ENCOUNTER — Inpatient Hospital Stay (HOSPITAL_COMMUNITY)
Admission: EM | Admit: 2019-10-02 | Discharge: 2019-10-07 | DRG: 177 | Disposition: A | Discharge status: Home / Self Care | Payer: BC Managed Care – PPO | Source: Other Acute Inpatient Hospital | Attending: Internal Medicine | Admitting: Internal Medicine

## 2019-10-02 DIAGNOSIS — Z9049 Acquired absence of other specified parts of digestive tract: Secondary | ICD-10-CM

## 2019-10-02 DIAGNOSIS — Z6835 Body mass index (BMI) 35.0-35.9, adult: Secondary | ICD-10-CM

## 2019-10-02 DIAGNOSIS — J189 Pneumonia, unspecified organism: Secondary | ICD-10-CM

## 2019-10-02 DIAGNOSIS — Z79899 Other long term (current) drug therapy: Secondary | ICD-10-CM | POA: Diagnosis not present

## 2019-10-02 DIAGNOSIS — G43909 Migraine, unspecified, not intractable, without status migrainosus: Secondary | ICD-10-CM | POA: Diagnosis not present

## 2019-10-02 DIAGNOSIS — E669 Obesity, unspecified: Secondary | ICD-10-CM | POA: Diagnosis present

## 2019-10-02 DIAGNOSIS — J1282 Pneumonia due to coronavirus disease 2019: Secondary | ICD-10-CM | POA: Diagnosis present

## 2019-10-02 DIAGNOSIS — U071 COVID-19: Principal | ICD-10-CM | POA: Diagnosis present

## 2019-10-02 DIAGNOSIS — N179 Acute kidney failure, unspecified: Secondary | ICD-10-CM | POA: Diagnosis present

## 2019-10-02 DIAGNOSIS — A0839 Other viral enteritis: Secondary | ICD-10-CM | POA: Diagnosis present

## 2019-10-02 DIAGNOSIS — Z820 Family history of epilepsy and other diseases of the nervous system: Secondary | ICD-10-CM | POA: Diagnosis not present

## 2019-10-02 DIAGNOSIS — J9601 Acute respiratory failure with hypoxia: Secondary | ICD-10-CM | POA: Diagnosis not present

## 2019-10-02 DIAGNOSIS — Z79890 Hormone replacement therapy (postmenopausal): Secondary | ICD-10-CM | POA: Diagnosis not present

## 2019-10-02 DIAGNOSIS — E038 Other specified hypothyroidism: Secondary | ICD-10-CM | POA: Diagnosis present

## 2019-10-02 DIAGNOSIS — Z800 Family history of malignant neoplasm of digestive organs: Secondary | ICD-10-CM | POA: Diagnosis not present

## 2019-10-02 DIAGNOSIS — Z823 Family history of stroke: Secondary | ICD-10-CM | POA: Diagnosis not present

## 2019-10-02 DIAGNOSIS — R1110 Vomiting, unspecified: Secondary | ICD-10-CM | POA: Diagnosis not present

## 2019-10-02 DIAGNOSIS — E039 Hypothyroidism, unspecified: Secondary | ICD-10-CM

## 2019-10-02 DIAGNOSIS — R0902 Hypoxemia: Secondary | ICD-10-CM | POA: Diagnosis not present

## 2019-10-02 DIAGNOSIS — E860 Dehydration: Secondary | ICD-10-CM | POA: Diagnosis not present

## 2019-10-02 HISTORY — DX: Other viral enteritis: A08.39

## 2019-10-02 HISTORY — DX: Migraine, unspecified, not intractable, without status migrainosus: G43.909

## 2019-10-02 HISTORY — DX: Acute kidney failure, unspecified: N17.9

## 2019-10-02 HISTORY — DX: Obesity, unspecified: E66.9

## 2019-10-02 HISTORY — DX: COVID-19: U07.1

## 2019-10-02 HISTORY — DX: Other specified hypothyroidism: E03.8

## 2019-10-02 LAB — CREATININE, SERUM
Creatinine, Ser: 1.58 mg/dL — ABNORMAL HIGH (ref 0.44–1.00)
GFR calc Af Amer: 43 mL/min — ABNORMAL LOW (ref >60)
GFR calc non Af Amer: 37 mL/min — ABNORMAL LOW (ref >60)

## 2019-10-02 LAB — CBC
HCT: 44.8 % (ref 36.0–46.0)
Hemoglobin: 14.5 g/dL (ref 12.0–15.0)
MCH: 28.5 pg (ref 26.0–34.0)
MCHC: 32.4 g/dL (ref 30.0–36.0)
MCV: 88 fL (ref 80.0–100.0)
Platelets: 277 10*3/uL (ref 150–400)
RBC: 5.09 MIL/uL (ref 3.87–5.11)
RDW: 13.2 % (ref 11.5–15.5)
WBC: 5 10*3/uL (ref 4.0–10.5)
nRBC: 0 % (ref 0.0–0.2)

## 2019-10-02 LAB — ABO/RH: ABO/RH(D): B POS

## 2019-10-02 LAB — GLUCOSE, CAPILLARY
Glucose-Capillary: 133 mg/dL — ABNORMAL HIGH (ref 70–99)
Glucose-Capillary: 155 mg/dL — ABNORMAL HIGH (ref 70–99)

## 2019-10-02 LAB — D-DIMER, QUANTITATIVE: D-Dimer, Quant: 1.51 ug/mL-FEU — ABNORMAL HIGH (ref 0.00–0.50)

## 2019-10-02 MED ORDER — SODIUM CHLORIDE 0.9% FLUSH
3.0000 mL | Freq: Two times a day (BID) | INTRAVENOUS | Status: DC
  Administered 2019-10-02 – 2019-10-07 (×11): 3 mL via INTRAVENOUS

## 2019-10-02 MED ORDER — SODIUM CHLORIDE 0.9 % IV SOLN
100.0000 mg | Freq: Once | INTRAVENOUS | Status: AC
  Administered 2019-10-02: 100 mg via INTRAVENOUS
  Filled 2019-10-02: qty 20

## 2019-10-02 MED ORDER — ACETAMINOPHEN 650 MG RE SUPP: 650.0000 mg | Freq: Four times a day (QID) | RECTAL | Status: DC | PRN

## 2019-10-02 MED ORDER — NADOLOL 80 MG PO TABS
80.0000 mg | ORAL_TABLET | Freq: Every day | ORAL | Status: DC
  Administered 2019-10-02 – 2019-10-03 (×2): 80 mg via ORAL
  Filled 2019-10-02 (×3): qty 1

## 2019-10-02 MED ORDER — POLYETHYLENE GLYCOL 3350 17 G PO PACK: 17.0000 g | PACK | Freq: Every day | ORAL | Status: DC | PRN

## 2019-10-02 MED ORDER — DEXAMETHASONE SODIUM PHOSPHATE 10 MG/ML IJ SOLN
6.0000 mg | INTRAMUSCULAR | Status: DC
  Administered 2019-10-03 – 2019-10-07 (×5): 6 mg via INTRAVENOUS
  Filled 2019-10-02 (×5): qty 1

## 2019-10-02 MED ORDER — SODIUM CHLORIDE 0.9 % IV SOLN
100.0000 mg | Freq: Every day | INTRAVENOUS | Status: AC
  Administered 2019-10-03 – 2019-10-05 (×3): 100 mg via INTRAVENOUS
  Filled 2019-10-02 (×3): qty 20

## 2019-10-02 MED ORDER — SODIUM CHLORIDE 0.9 % IV SOLN: INTRAVENOUS | Status: DC

## 2019-10-02 MED ORDER — LEVOTHYROXINE SODIUM 50 MCG PO TABS
100.0000 ug | ORAL_TABLET | Freq: Every day | ORAL | Status: DC
  Administered 2019-10-03 – 2019-10-07 (×5): 100 ug via ORAL
  Filled 2019-10-02 (×5): qty 2

## 2019-10-02 MED ORDER — ONDANSETRON HCL 4 MG PO TABS: 4.0000 mg | ORAL_TABLET | Freq: Four times a day (QID) | ORAL | Status: DC | PRN

## 2019-10-02 MED ORDER — SUMATRIPTAN SUCCINATE 50 MG PO TABS
100.0000 mg | ORAL_TABLET | ORAL | Status: DC | PRN
  Administered 2019-10-03: 100 mg via ORAL
  Filled 2019-10-02 (×2): qty 2

## 2019-10-02 MED ORDER — ONDANSETRON HCL 4 MG/2ML IJ SOLN: 4.0000 mg | Freq: Four times a day (QID) | INTRAMUSCULAR | Status: DC | PRN

## 2019-10-02 MED ORDER — ENOXAPARIN SODIUM 60 MG/0.6ML ~~LOC~~ SOLN
50.0000 mg | SUBCUTANEOUS | Status: DC
  Administered 2019-10-02 – 2019-10-06 (×5): 50 mg via SUBCUTANEOUS
  Filled 2019-10-02 (×6): qty 0.6

## 2019-10-02 MED ORDER — ACETAMINOPHEN 325 MG PO TABS
650.0000 mg | ORAL_TABLET | Freq: Four times a day (QID) | ORAL | Status: DC | PRN
  Administered 2019-10-03: 650 mg via ORAL
  Filled 2019-10-02: qty 2

## 2019-10-02 MED ORDER — INSULIN ASPART 100 UNIT/ML ~~LOC~~ SOLN
0.0000 [IU] | Freq: Three times a day (TID) | SUBCUTANEOUS | Status: DC
  Administered 2019-10-02: 1 [IU] via SUBCUTANEOUS
  Administered 2019-10-03: 2 [IU] via SUBCUTANEOUS
  Administered 2019-10-03 (×2): 1 [IU] via SUBCUTANEOUS
  Administered 2019-10-04: 2 [IU] via SUBCUTANEOUS
  Administered 2019-10-04 – 2019-10-05 (×2): 1 [IU] via SUBCUTANEOUS
  Administered 2019-10-05 – 2019-10-06 (×2): 2 [IU] via SUBCUTANEOUS

## 2019-10-02 NOTE — Progress Notes (Signed)
Pharmacy - Remdesivir   Transfer from [facility] Received Remdesivir 200 mg iv x 1 1/3 Last ALT documented as 98  Plan: Remdesivir 100 mg iv Q 24 x 4 doses starting today Lovenox increased to 50 mg daily for weight  Thank you Okey Regal, PharmD

## 2019-10-02 NOTE — H&P (Signed)
History and Physical  ELSE HABERMANN KVQ:[id number] DOB: 1965/11/08 DOA: 10/02/2019  Referring physician: Junious Silk, hospitalist nurse practitioner PCP: Blane Ohara, MD  Outpatient Specialists: None Patient coming from: Home & is able to ambulate without assistance  Chief Complaint: Shortness of breath  HPI: Taylor Ho is a 54 y.o. female with medical history significant for hypothyroidism and migraine headaches who presented to the [facility] emergency room on 1/3 with complaints of shortness of breath.  She had been diagnosed a week prior with Covid.  In the emergency room, she was found to be mildly hypoxic requiring 3 L nasal cannula.  Repeat Covid test positive and chest x-ray noted bilateral opacities.  Patient given Remdisivir and IV Decadron.  However, overnight, oxygenation grew worse and she ended up needing 12 L high flow nasal cannula to keep oxygen saturations between 86 and 91%.  She was transition to 100% high flow mask and arrange for transfer to [facility].  Prior to leaving [facility], she was given a dose of Actemra.  Following arrival to [facility], patient is currently on 15 L high flow facemask.  She is feeling actually comfortable and denies any other complaints other than a cough with deep inspiration.   Review of Systems: Patient seen after arrival to floor. Pt complains of cough with deep inspiration, nonproductive  Pt denies any headaches, vision changes, dysphagia, chest pain, palpitations, wheeze, abdominal pain, hematuria, dysuria, constipation, diarrhea, focal extremity numbness weakness or pain.  Review of systems are otherwise negative   Past Medical History:  Diagnosis Date  . Headache   . Hypothyroidism    Past Surgical History:  Procedure Laterality Date  . CHOLECYSTECTOMY    . KNEE ARTHROSCOPY Left 08/13/2017   Procedure: ARTHROSCOPY KNEE;  Surgeon: Jodi Geralds, MD;  Location: [facility] OR;  Service: Orthopedics;  Laterality: Left;   . TUBAL LIGATION      Social History:  reports that she has never smoked. She has never used smokeless tobacco. She reports that she does not drink alcohol or use drugs. Lives at home with her husband.  Ambulates without assistance  No Known Allergies  Family History  Problem Relation Age of Onset  . Alzheimer's disease Mother   . Alzheimer's disease Father   . Colon cancer Father   . Colon cancer Sister   . Stroke Brother   . Colitis Sister   . Irritable bowel syndrome Sister       Prior to Admission medications   Medication Sig Start Date End Date Taking? Authorizing Provider  Galcanezumab-gnlm (EMGALITY [place]) Inject 120 mg into the skin every 30 (thirty) days.    [provider]  levothyroxine (SYNTHROID, LEVOTHROID) 100 MCG tablet Take 100 mcg daily before breakfast by mouth.    [provider]  nadolol (CORGARD) 80 MG tablet Take 80 mg daily by mouth.    [provider]  phentermine (ADIPEX-P) 37.5 MG tablet Take 37.5 mg by mouth daily. 07/08/18   [provider]  SUMAtriptan (IMITREX) 100 MG tablet Take 100 mg every 2 (two) hours as needed by mouth for migraine. May repeat in 2 hours if headache persists or recurs.    [provider]    Physical Exam: BP 102/67 (BP Location: Left Arm)   Pulse 66   Temp 97.7 F (36.5 C) (Tympanic)   Ht 5\' 6"  (1.676 m)   Wt 99.2 kg   SpO2 96%   BMI 35.30 kg/m   General: Alert and oriented  x3, no acute distress Eyes: Sclera nonicteric, extraocular movements are intact ENT: Normocephalic and atraumatic, mucous membranes slightly dry Neck: Supple, no JVD Cardiovascular: Regular rate and rhythm, S1-S2 Respiratory: Decreased breath sounds with scattered rails bilaterally Abdomen: Soft, nontender, nondistended, positive bowel sounds Skin: No skin breaks, tears or lesions Musculoskeletal: No clubbing or cyanosis or edema Psychiatric: Appropriate, no evidence of psychoses Neurologic: No focal  deficits          Labs on Admission:  Basic Metabolic Panel: No results for input(s): NA, K, CL, CO2, GLUCOSE, BUN, CREATININE, CALCIUM, MG, PHOS in the last 168 hours. Liver Function Tests: No results for input(s): AST, ALT, ALKPHOS, BILITOT, PROT, ALBUMIN in the last 168 hours. No results for input(s): LIPASE, AMYLASE in the last 168 hours. No results for input(s): AMMONIA in the last 168 hours. CBC: No results for input(s): WBC, NEUTROABS, HGB, HCT, MCV, PLT in the last 168 hours. Cardiac Enzymes: No results for input(s): CKTOTAL, CKMB, CKMBINDEX, TROPONINI in the last 168 hours.  BNP (last 3 results) No results for input(s): BNP in the last 8760 hours.  ProBNP (last 3 results) No results for input(s): PROBNP in the last 8760 hours.  CBG: No results for input(s): GLUCAP in the last 168 hours.  Radiological Exams on Admission: No results found.  EKG: Independently reviewed.  The one at [facility] notes normal sinus rhythm  Assessment/Plan Present on Admission: . Migraine headache: Stable for now.  Continue home medications.  . Hypothyroidism: Continue Synthroid.  Marland Kitchen COVID-19 virus infection causing acute respiratory failure with hypoxia ([place]): Continue Remdisivir and IV steroids.  Follow inflammatory markers including CRP.  Currently needing 15 L high flow oxygen.  Status post 1 dose of Actemra given at [facility].  . AKI (acute kidney injury) ([place]): No reports of previous kidney issues.  This may be secondary dehydration.  Hydrating and will recheck in the morning.  . Obesity (BMI 30-39.9): Patient meets criteria BMI greater than 30  Principal Problem:   Acute respiratory failure with hypoxia (HCC) Active Problems:   Migraine headache   Hypothyroidism   COVID-19 virus infection   AKI (acute kidney injury) ([place])   Obesity (BMI 30-39.9)   DVT prophylaxis: Lovenox for now, D-dimer pending to see if we need to increase dosage  Code Status: Full code  Family  Communication: Updated husband by phone  Disposition Plan: She will likely be here for several days until we are able to improve her oxygenation status.  Hopefully does not get worse.  Consults called: None  Admission status: Given need for acute hospital services and will certainly be here past 2 midnights, admit as inpatient    Annita Brod MD [facility] Pager [phone]  If 7PM-7AM, please contact night-coverage [email or web address] Password [facility]  10/02/2019, 3:25 PM

## 2019-10-02 NOTE — Progress Notes (Signed)
Pt assessed for PIV access with [place]. Currently has PIV in L AC. No appropriate vein found for PIV/midline/extended dwell due to depth of veins. Also, nerve bundle and artery surround right brachial vein making it unreachable by PIV stick.

## 2019-10-02 NOTE — Plan of Care (Signed)
Progressing towards goals as expected.

## 2019-10-03 ENCOUNTER — Inpatient Hospital Stay (HOSPITAL_COMMUNITY): Payer: BC Managed Care – PPO

## 2019-10-03 DIAGNOSIS — R0902 Hypoxemia: Secondary | ICD-10-CM

## 2019-10-03 DIAGNOSIS — E669 Obesity, unspecified: Secondary | ICD-10-CM

## 2019-10-03 DIAGNOSIS — U071 COVID-19: Secondary | ICD-10-CM

## 2019-10-03 LAB — GLUCOSE, CAPILLARY
Glucose-Capillary: 155 mg/dL — ABNORMAL HIGH (ref 70–99)
Glucose-Capillary: 136 mg/dL — ABNORMAL HIGH (ref 70–99)
Glucose-Capillary: 137 mg/dL — ABNORMAL HIGH (ref 70–99)
Glucose-Capillary: 138 mg/dL — ABNORMAL HIGH (ref 70–99)

## 2019-10-03 MED ORDER — NADOLOL 40 MG PO TABS
40.0000 mg | ORAL_TABLET | Freq: Every day | ORAL | Status: DC
  Administered 2019-10-05: 40 mg via ORAL
  Filled 2019-10-03 (×4): qty 1

## 2019-10-03 MED ORDER — DIPHENHYDRAMINE HCL 25 MG PO CAPS
25.0000 mg | ORAL_CAPSULE | Freq: Every evening | ORAL | Status: DC | PRN
  Administered 2019-10-03: 25 mg via ORAL
  Filled 2019-10-03 (×2): qty 1

## 2019-10-03 NOTE — Progress Notes (Signed)
PROGRESS NOTE  Taylor Ho:[id number] DOB: [birth year] DOA: 10/02/2019 PCP: Rochel Brome, MD  HPI/Recap of past 24 hours: Taylor Ho is a 54 y.o. female with medical history significant for hypothyroidism and migraine headaches who presented to the [facility] emergency room on 1/3 with complaints of shortness of breath.  She had been diagnosed a week prior with Covid.  In the emergency room, she was found to be mildly hypoxic requiring 3 L nasal cannula.  Repeat Covid test positive and chest x-ray noted bilateral opacities.  Patient given Remdisivir and IV Decadron.  However, overnight, oxygenation grew worse and she ended up needing 12 L high flow nasal cannula to keep oxygen saturations between 86 and 91%.  She was transition to 100% high flow mask and arrange for transfer to [facility].  Prior to leaving [place], she was given a dose of Actemra.  After arriving to [facility], she was on 15 L.   This morning, she is actually feeling better and down to 12 L nasal cannula.  No other complaints.  Assessment/Plan: . Migraine headache: Stable for now.  Continue home medications.  . Hypothyroidism: Continue Synthroid.  Marland Kitchen COVID-19 virus infection causing acute respiratory failure with hypoxia ([place]): Continue Remdisivir and IV steroids.  Follow inflammatory markers including CRP.  Starting to wean down, currently on 12 L high flow nasal cannula.   Status post 1 dose of Actemra given at [facility].  With elevated D-dimer and significant hypoxia, lower extremity Dopplers were done on 1/5.  Negative for DVT.  Marland Kitchen AKI (acute kidney injury) ([place]): No reports of previous kidney issues.  This may be secondary dehydration.    . Obesity (BMI 30-39.9): Patient meets criteria BMI greater than 30  Code Status: Full code  Family Communication: Updated husband by phone.  Please note his phone number is incorrect on the contact information sheet and the correct number is  [phone]  Disposition Plan: Home once able to be weaned off of oxygen   Consultants:  None  Procedures:    Lower extremity Dopplers done 1/5: No evidence of DVT.  Antimicrobials:  IV Remdisivir 1/3-present  IV Actemra x1 dose  DVT prophylaxis: Lovenox   Objective: Vitals:   10/03/19 0843 10/03/19 1200  BP: 130/76 129/76  Pulse: (!) 56 (!) 59  Resp:  (!) 32  Temp:  97.7 F (36.5 C)  SpO2:  91%    Intake/Output Summary (Last 24 hours) at 10/03/2019 1629 Last data filed at 10/03/2019 1000 Gross per 24 hour  Intake 699.52 ml  Output 950 ml  Net -250.48 ml   Filed Weights   10/02/19 1400 10/02/19 1401  Weight: 99 kg 99.2 kg   Body mass index is 35.3 kg/m.  Exam:   General: Alert and oriented x3, no acute distress  Cardiovascular: Regular rate and rhythm, S1-S2  Respiratory: Clear to auscultation bilaterally  Abdomen: Soft, nontender, nondistended, positive bowel sounds  Musculoskeletal: No clubbing or cyanosis or edema  Skin: No skin breaks, tears or lesions  Psychiatry: Appropriate, no evidence of psychoses   Data Reviewed: CBC: Recent Labs  Lab 10/02/19 1510  WBC 5.0  HGB 14.5  HCT 44.8  MCV 88.0  PLT 562   Basic Metabolic Panel: Recent Labs  Lab 10/02/19 1510  CREATININE 1.58*   GFR: Estimated Creatinine Clearance: 48.9 mL/min (A) (by C-G formula based on SCr of 1.58 mg/dL (H)). Liver Function Tests: No results for input(s): AST, ALT, ALKPHOS, BILITOT, PROT, ALBUMIN  in the last 168 hours. No results for input(s): LIPASE, AMYLASE in the last 168 hours. No results for input(s): AMMONIA in the last 168 hours. Coagulation Profile: No results for input(s): INR, PROTIME in the last 168 hours. Cardiac Enzymes: No results for input(s): CKTOTAL, CKMB, CKMBINDEX, TROPONINI in the last 168 hours. BNP (last 3 results) No results for input(s): PROBNP in the last 8760 hours. HbA1C: No results for input(s): HGBA1C in the last 72  hours. CBG: Recent Labs  Lab 10/02/19 1657 10/02/19 2133 10/03/19 0736 10/03/19 1144 10/03/19 1559  GLUCAP 133* 155* 155* 136* 137*   Lipid Profile: No results for input(s): CHOL, HDL, LDLCALC, TRIG, CHOLHDL, LDLDIRECT in the last 72 hours. Thyroid Function Tests: No results for input(s): TSH, T4TOTAL, FREET4, T3FREE, THYROIDAB in the last 72 hours. Anemia Panel: No results for input(s): VITAMINB12, FOLATE, FERRITIN, TIBC, IRON, RETICCTPCT in the last 72 hours. Urine analysis: No results found for: COLORURINE, APPEARANCEUR, LABSPEC, PHURINE, GLUCOSEU, HGBUR, BILIRUBINUR, KETONESUR, PROTEINUR, UROBILINOGEN, NITRITE, LEUKOCYTESUR Sepsis Labs: @LABRCNTIP (procalcitonin:4,lacticidven:4)  )No results found for this or any previous visit (from the past 240 hour(s)).    Studies: VAS LOWER EXTREMITY VENOUS (DVT)  Result Date: 10/03/2019  Lower Venous Study Indications: Hypoxia, COVID-19 positive.  Limitations: Poor ultrasound/tissue interface and body habitus. Comparison Study: No prior study. Performing Technologist: 12/01/2019 RDMS, RVT, RDCS  Examination Guidelines: A complete evaluation includes B-mode imaging, spectral Doppler, color Doppler, and power Doppler as needed of all accessible portions of each vessel. Bilateral testing is considered an integral part of a complete examination. Limited examinations for reoccurring indications may be performed as noted.  +---------+---------------+---------+-----------+----------+--------------+ RIGHT    CompressibilityPhasicitySpontaneityPropertiesThrombus Aging +---------+---------------+---------+-----------+----------+--------------+ CFV      Full           Yes      Yes                                 +---------+---------------+---------+-----------+----------+--------------+ SFJ      Full                                                         +---------+---------------+---------+-----------+----------+--------------+ FV Prox  Full                                                        +---------+---------------+---------+-----------+----------+--------------+ FV Mid   Full                                                        +---------+---------------+---------+-----------+----------+--------------+ FV DistalFull                                                        +---------+---------------+---------+-----------+----------+--------------+ PFV      Full                                                        +---------+---------------+---------+-----------+----------+--------------+  POP      Full           Yes      Yes                                 +---------+---------------+---------+-----------+----------+--------------+ PTV      Full                                                        +---------+---------------+---------+-----------+----------+--------------+ PERO     Full                                                        +---------+---------------+---------+-----------+----------+--------------+   +---------+---------------+---------+-----------+----------+--------------+ LEFT     CompressibilityPhasicitySpontaneityPropertiesThrombus Aging +---------+---------------+---------+-----------+----------+--------------+ CFV      Full           Yes      Yes                                 +---------+---------------+---------+-----------+----------+--------------+ SFJ      Full                                                        +---------+---------------+---------+-----------+----------+--------------+ FV Prox  Full                                                        +---------+---------------+---------+-----------+----------+--------------+ FV Mid   Full                                                         +---------+---------------+---------+-----------+----------+--------------+ FV DistalFull                                                        +---------+---------------+---------+-----------+----------+--------------+ PFV      Full                                                        +---------+---------------+---------+-----------+----------+--------------+ POP      Full           Yes      Yes                                 +---------+---------------+---------+-----------+----------+--------------+  PTV      Full                                                        +---------+---------------+---------+-----------+----------+--------------+ PERO                                                  Not visualized +---------+---------------+---------+-----------+----------+--------------+     Summary: Right: There is no evidence of deep vein thrombosis in the lower extremity. No cystic structure found in the popliteal fossa. Left: There is no evidence of deep vein thrombosis in the lower extremity. However, portions of this examination were limited- see technologist comments above. No cystic structure found in the popliteal fossa.  *See table(s) above for measurements and observations. Electronically signed by Coral Else MD on 10/03/2019 at 1:44:19 PM.    Final     Scheduled Meds: . dexamethasone (DECADRON) injection  6 mg Intravenous Q24H  . enoxaparin (LOVENOX) injection  50 mg Subcutaneous Q24H  . insulin aspart  0-9 Units Subcutaneous TID WC  . levothyroxine  100 mcg Oral Q0600  . nadolol  80 mg Oral Daily  . sodium chloride flush  3 mL Intravenous Q12H    Continuous Infusions: . sodium chloride 75 mL/hr at 10/02/19 1900  . remdesivir 100 mg in NS 100 mL 100 mg (10/03/19 0852)     LOS: 1 day     Hollice Espy, MD [facility]  To reach me or the doctor on call, go to: [email or web address] Password [facility]  10/03/2019, 4:29 PM

## 2019-10-03 NOTE — Progress Notes (Signed)
Bilateral lower extremity venous duplex completed. Refer to "CV Proc" under chart review to view preliminary results.  10/03/2019 1:08 PM Eula Fried., MHA, RVT, RDCS, RDMS

## 2019-10-04 DIAGNOSIS — J9601 Acute respiratory failure with hypoxia: Secondary | ICD-10-CM

## 2019-10-04 LAB — CBC WITH DIFFERENTIAL/PLATELET
Abs Immature Granulocytes: 0.33 10*3/uL — ABNORMAL HIGH (ref 0.00–0.07)
Basophils Absolute: 0.1 10*3/uL (ref 0.0–0.1)
Basophils Relative: 0 %
Eosinophils Absolute: 0 10*3/uL (ref 0.0–0.5)
Eosinophils Relative: 0 %
HCT: 48.5 % — ABNORMAL HIGH (ref 36.0–46.0)
Hemoglobin: 15.1 g/dL — ABNORMAL HIGH (ref 12.0–15.0)
Immature Granulocytes: 3 %
Lymphocytes Relative: 11 %
Lymphs Abs: 1.3 10*3/uL (ref 0.7–4.0)
MCH: 27.6 pg (ref 26.0–34.0)
MCHC: 31.1 g/dL (ref 30.0–36.0)
MCV: 88.7 fL (ref 80.0–100.0)
Monocytes Absolute: 0.9 10*3/uL (ref 0.1–1.0)
Monocytes Relative: 8 %
Neutro Abs: 9.9 10*3/uL — ABNORMAL HIGH (ref 1.7–7.7)
Neutrophils Relative %: 78 %
Platelets: 449 10*3/uL — ABNORMAL HIGH (ref 150–400)
RBC: 5.47 MIL/uL — ABNORMAL HIGH (ref 3.87–5.11)
RDW: 13 % (ref 11.5–15.5)
WBC: 12.6 10*3/uL — ABNORMAL HIGH (ref 4.0–10.5)
nRBC: 0 % (ref 0.0–0.2)

## 2019-10-04 LAB — FERRITIN: Ferritin: 783 ng/mL — ABNORMAL HIGH (ref 11–307)

## 2019-10-04 LAB — COMPREHENSIVE METABOLIC PANEL
ALT: 77 U/L — ABNORMAL HIGH (ref 0–44)
AST: 52 U/L — ABNORMAL HIGH (ref 15–41)
Albumin: 3.3 g/dL — ABNORMAL LOW (ref 3.5–5.0)
Alkaline Phosphatase: 98 U/L (ref 38–126)
Anion gap: 12 (ref 5–15)
BUN: 38 mg/dL — ABNORMAL HIGH (ref 6–20)
CO2: 23 mmol/L (ref 22–32)
Calcium: 8.9 mg/dL (ref 8.9–10.3)
Chloride: 107 mmol/L (ref 98–111)
Creatinine, Ser: 1.35 mg/dL — ABNORMAL HIGH (ref 0.44–1.00)
GFR calc Af Amer: 52 mL/min — ABNORMAL LOW (ref >60)
GFR calc non Af Amer: 45 mL/min — ABNORMAL LOW (ref >60)
Glucose, Bld: 131 mg/dL — ABNORMAL HIGH (ref 70–99)
Potassium: 4.7 mmol/L (ref 3.5–5.1)
Sodium: 142 mmol/L (ref 135–145)
Total Bilirubin: 0.8 mg/dL (ref 0.3–1.2)
Total Protein: 7.5 g/dL (ref 6.5–8.1)

## 2019-10-04 LAB — GLUCOSE, CAPILLARY
Glucose-Capillary: 122 mg/dL — ABNORMAL HIGH (ref 70–99)
Glucose-Capillary: 143 mg/dL — ABNORMAL HIGH (ref 70–99)
Glucose-Capillary: 182 mg/dL — ABNORMAL HIGH (ref 70–99)
Glucose-Capillary: 151 mg/dL — ABNORMAL HIGH (ref 70–99)

## 2019-10-04 LAB — C-REACTIVE PROTEIN: CRP: 1.2 mg/dL — ABNORMAL HIGH (ref <1.0)

## 2019-10-04 LAB — HIV ANTIBODY (ROUTINE TESTING W REFLEX): HIV Screen 4th Generation wRfx: NONREACTIVE

## 2019-10-04 LAB — HEMOGLOBIN A1C
Hgb A1c MFr Bld: 6 % — ABNORMAL HIGH (ref 4.8–5.6)
Mean Plasma Glucose: 125.5 mg/dL

## 2019-10-04 LAB — D-DIMER, QUANTITATIVE: D-Dimer, Quant: 5.35 ug/mL-FEU — ABNORMAL HIGH (ref 0.00–0.50)

## 2019-10-04 MED ORDER — BOOST / RESOURCE BREEZE PO LIQD CUSTOM
1.0000 | Freq: Three times a day (TID) | ORAL | Status: DC
  Administered 2019-10-05 – 2019-10-06 (×4): 1 via ORAL
  Filled 2019-10-04 (×11): qty 1

## 2019-10-04 NOTE — Progress Notes (Signed)
PROGRESS NOTE    Taylor Ho  [id number]:[id number] DOB: 1965-12-10 DOA: 10/02/2019 PCP: Blane Ohara, MD    Brief Narrative:  54 year old with history of hypothyroidism and migraine presented to [facility] emergency room on 1/3 with complaints of shortness of breath.  Diagnosed with COVID-19 a week prior.  In the emergency room, she was mildly hypoxic requiring 3 L nasal cannula oxygen.  Chest x-ray with bilateral opacities.  Given remdesivir and IV Decadron.  Oxygen requirements worsening.  Transfer to [employer] on 15 L oxygen.   Assessment & Plan:   Principal Problem:   Acute respiratory failure with hypoxia (HCC) Active Problems:   Migraine headache   Hypothyroidism   COVID-19 virus infection   AKI (acute kidney injury) (HCC)   Obesity (BMI 30-39.9)  Acute respiratory failure with hypoxia, pneumonia due to COVID-19 virus: Continue to monitor due to significant symptoms, still on significant oxygen. chest physiotherapy, incentive spirometry, deep breathing exercises, sputum induction, mucolytic's and bronchodilators. Supplemental oxygen to keep saturations more than 90%. Covid directed therapy with , steroids, on dexamethasone that we will continue remdesivir, day 4/5 actemra, given 1 dose at [facility] antibiotics, not indicated Due to severity of symptoms, patient will need daily inflammatory markers, liver function test to monitor and direct COVID-19 therapies.  Hypothyroidism: Clinically euthyroid on Synthroid replacement.  Acute kidney injury: Improved.  Discontinue IV fluids.   DVT prophylaxis: Lovenox subcu Code Status: Full code Family Communication: None Disposition Plan: Home after improvement   Consultants:   None  Procedures:   None  Antimicrobials:  Anti-infectives (From admission, onward)   Start     Dose/Rate Route Frequency Ordered Stop   10/03/19 1000  remdesivir 100 mg in sodium chloride 0.9 % 100 mL IVPB     100 mg 200  mL/hr over 30 Minutes Intravenous Daily 10/02/19 1433 10/06/19 0959   10/02/19 1600  remdesivir 100 mg in sodium chloride 0.9 % 100 mL IVPB     100 mg 200 mL/hr over 30 Minutes Intravenous  Once 10/02/19 1433 10/02/19 1753         Subjective: Patient seen and examined.  No overnight events.  Still remains on significant oxygen anywhere between 11 to 15 L high flow nasal cannula.  Unable to take deep breaths with cough.  Afebrile.  Tolerating medications well.  Objective: Vitals:   10/03/19 2115 10/04/19 0000 10/04/19 0400 10/04/19 0901  BP: 117/80 125/85 (!) 102/56   Pulse: (!) 55 (!) 56 (!) 53 (!) 56  Resp: (!) 28 (!) 27 (!) 22   Temp: 98.3 F (36.8 C) 97.8 F (36.6 C) 98.2 F (36.8 C)   TempSrc: Oral Oral Oral   SpO2: 91% 95% 93%   Weight:      Height:        Intake/Output Summary (Last 24 hours) at 10/04/2019 1456 Last data filed at 10/03/2019 1700 Gross per 24 hour  Intake 120 ml  Output 350 ml  Net -230 ml   Filed Weights   10/02/19 1400 10/02/19 1401  Weight: 99 kg 99.2 kg    Examination:  General exam: Appears calm and comfortable on 11 L high flow nasal cannula. Respiratory system: Clear to auscultation. Respiratory effort normal.  No added sounds. Cardiovascular system: S1 & S2 heard, RRR. No JVD, murmurs, rubs, gallops or clicks. No pedal edema. Gastrointestinal system: Abdomen is nondistended, soft and nontender. No organomegaly or masses felt. Normal bowel sounds heard. Central nervous system: Alert and oriented. No  focal neurological deficits. Extremities: Symmetric 5 x 5 power. Skin: No rashes, lesions or ulcers Psychiatry: Judgement and insight appear normal. Mood & affect appropriate.     Data Reviewed: I have personally reviewed following labs and imaging studies  CBC: Recent Labs  Lab 10/02/19 1510 10/04/19 0750  WBC 5.0 12.6*  NEUTROABS  --  9.9*  HGB 14.5 15.1*  HCT 44.8 48.5*  MCV 88.0 88.7  PLT 277 119*   Basic Metabolic Panel:  Recent Labs  Lab 10/02/19 1510 10/04/19 0750  NA  --  142  K  --  4.7  CL  --  107  CO2  --  23  GLUCOSE  --  131*  BUN  --  38*  CREATININE 1.58* 1.35*  CALCIUM  --  8.9   GFR: Estimated Creatinine Clearance: 57.3 mL/min (A) (by C-G formula based on SCr of 1.35 mg/dL (H)). Liver Function Tests: Recent Labs  Lab 10/04/19 0750  AST 52*  ALT 77*  ALKPHOS 98  BILITOT 0.8  PROT 7.5  ALBUMIN 3.3*   No results for input(s): LIPASE, AMYLASE in the last 168 hours. No results for input(s): AMMONIA in the last 168 hours. Coagulation Profile: No results for input(s): INR, PROTIME in the last 168 hours. Cardiac Enzymes: No results for input(s): CKTOTAL, CKMB, CKMBINDEX, TROPONINI in the last 168 hours. BNP (last 3 results) No results for input(s): PROBNP in the last 8760 hours. HbA1C: Recent Labs    10/04/19 0750  HGBA1C 6.0*   CBG: Recent Labs  Lab 10/03/19 1144 10/03/19 1559 10/03/19 2118 10/04/19 0857 10/04/19 1156  GLUCAP 136* 137* 138* 122* 143*   Lipid Profile: No results for input(s): CHOL, HDL, LDLCALC, TRIG, CHOLHDL, LDLDIRECT in the last 72 hours. Thyroid Function Tests: No results for input(s): TSH, T4TOTAL, FREET4, T3FREE, THYROIDAB in the last 72 hours. Anemia Panel: Recent Labs    10/04/19 0750  FERRITIN 783*   Sepsis Labs: No results for input(s): PROCALCITON, LATICACIDVEN in the last 168 hours.  No results found for this or any previous visit (from the past 240 hour(s)).       Radiology Studies: VAS [place] LOWER EXTREMITY VENOUS (DVT)  Result Date: 10/03/2019  Lower Venous Study Indications: Hypoxia, COVID-19 positive.  Limitations: Poor ultrasound/tissue interface and body habitus. Comparison Study: No prior study. Performing Technologist: Maudry Mayhew RDMS, RVT, RDCS  Examination Guidelines: A complete evaluation includes B-mode imaging, spectral Doppler, color Doppler, and power Doppler as needed of all accessible portions of each  vessel. Bilateral testing is considered an integral part of a complete examination. Limited examinations for reoccurring indications may be performed as noted.  +---------+---------------+---------+-----------+----------+--------------+ RIGHT    CompressibilityPhasicitySpontaneityPropertiesThrombus Aging +---------+---------------+---------+-----------+----------+--------------+ CFV      Full           Yes      Yes                                 +---------+---------------+---------+-----------+----------+--------------+ SFJ      Full                                                        +---------+---------------+---------+-----------+----------+--------------+ FV Prox  Full                                                        +---------+---------------+---------+-----------+----------+--------------+  FV Mid   Full                                                        +---------+---------------+---------+-----------+----------+--------------+ FV DistalFull                                                        +---------+---------------+---------+-----------+----------+--------------+ PFV      Full                                                        +---------+---------------+---------+-----------+----------+--------------+ POP      Full           Yes      Yes                                 +---------+---------------+---------+-----------+----------+--------------+ PTV      Full                                                        +---------+---------------+---------+-----------+----------+--------------+ PERO     Full                                                        +---------+---------------+---------+-----------+----------+--------------+   +---------+---------------+---------+-----------+----------+--------------+ LEFT     CompressibilityPhasicitySpontaneityPropertiesThrombus Aging  +---------+---------------+---------+-----------+----------+--------------+ CFV      Full           Yes      Yes                                 +---------+---------------+---------+-----------+----------+--------------+ SFJ      Full                                                        +---------+---------------+---------+-----------+----------+--------------+ FV Prox  Full                                                        +---------+---------------+---------+-----------+----------+--------------+ FV Mid   Full                                                        +---------+---------------+---------+-----------+----------+--------------+   FV DistalFull                                                        +---------+---------------+---------+-----------+----------+--------------+ PFV      Full                                                        +---------+---------------+---------+-----------+----------+--------------+ POP      Full           Yes      Yes                                 +---------+---------------+---------+-----------+----------+--------------+ PTV      Full                                                        +---------+---------------+---------+-----------+----------+--------------+ PERO                                                  Not visualized +---------+---------------+---------+-----------+----------+--------------+     Summary: Right: There is no evidence of deep vein thrombosis in the lower extremity. No cystic structure found in the popliteal fossa. Left: There is no evidence of deep vein thrombosis in the lower extremity. However, portions of this examination were limited- see technologist comments above. No cystic structure found in the popliteal fossa.  *See table(s) above for measurements and observations. Electronically signed by Coral Else MD on 10/03/2019 at 1:44:19 PM.    Final          Scheduled Meds: . dexamethasone (DECADRON) injection  6 mg Intravenous Q24H  . enoxaparin (LOVENOX) injection  50 mg Subcutaneous Q24H  . feeding supplement  1 Container Oral TID BM  . insulin aspart  0-9 Units Subcutaneous TID WC  . levothyroxine  100 mcg Oral Q0600  . nadolol  40 mg Oral Daily  . sodium chloride flush  3 mL Intravenous Q12H   Continuous Infusions: . sodium chloride 75 mL/hr at 10/02/19 1900  . remdesivir 100 mg in NS 100 mL 100 mg (10/04/19 1115)     LOS: 2 days     Time spent: 25 minutes    Dorcas Carrow, MD [facility] Pager [phone]

## 2019-10-04 NOTE — Plan of Care (Signed)
  Problem: Education: Goal: Knowledge of risk factors and measures for prevention of condition will improve Outcome: Progressing   Problem: Coping: Goal: Psychosocial and spiritual needs will be supported Outcome: Progressing   Problem: Respiratory: Goal: Will maintain a patent airway Outcome: Progressing Goal: Complications related to the disease process, condition or treatment will be avoided or minimized Outcome: Progressing   

## 2019-10-05 ENCOUNTER — Inpatient Hospital Stay (HOSPITAL_COMMUNITY): Payer: BC Managed Care – PPO

## 2019-10-05 LAB — COMPREHENSIVE METABOLIC PANEL
ALT: 65 U/L — ABNORMAL HIGH (ref 0–44)
AST: 37 U/L (ref 15–41)
Albumin: 2.8 g/dL — ABNORMAL LOW (ref 3.5–5.0)
Alkaline Phosphatase: 77 U/L (ref 38–126)
Anion gap: 9 (ref 5–15)
BUN: 36 mg/dL — ABNORMAL HIGH (ref 6–20)
CO2: 22 mmol/L (ref 22–32)
Calcium: 8.4 mg/dL — ABNORMAL LOW (ref 8.9–10.3)
Chloride: 107 mmol/L (ref 98–111)
Creatinine, Ser: 1.28 mg/dL — ABNORMAL HIGH (ref 0.44–1.00)
GFR calc Af Amer: 55 mL/min — ABNORMAL LOW (ref >60)
GFR calc non Af Amer: 48 mL/min — ABNORMAL LOW (ref >60)
Glucose, Bld: 110 mg/dL — ABNORMAL HIGH (ref 70–99)
Potassium: 4.7 mmol/L (ref 3.5–5.1)
Sodium: 138 mmol/L (ref 135–145)
Total Bilirubin: 0.4 mg/dL (ref 0.3–1.2)
Total Protein: 6.4 g/dL — ABNORMAL LOW (ref 6.5–8.1)

## 2019-10-05 LAB — CBC WITH DIFFERENTIAL/PLATELET
Abs Immature Granulocytes: 0.29 10*3/uL — ABNORMAL HIGH (ref 0.00–0.07)
Basophils Absolute: 0.1 10*3/uL (ref 0.0–0.1)
Basophils Relative: 0 %
Eosinophils Absolute: 0 10*3/uL (ref 0.0–0.5)
Eosinophils Relative: 0 %
HCT: 41.7 % (ref 36.0–46.0)
Hemoglobin: 13.2 g/dL (ref 12.0–15.0)
Immature Granulocytes: 3 %
Lymphocytes Relative: 13 %
Lymphs Abs: 1.5 10*3/uL (ref 0.7–4.0)
MCH: 28.2 pg (ref 26.0–34.0)
MCHC: 31.7 g/dL (ref 30.0–36.0)
MCV: 89.1 fL (ref 80.0–100.0)
Monocytes Absolute: 0.7 10*3/uL (ref 0.1–1.0)
Monocytes Relative: 6 %
Neutro Abs: 9.3 10*3/uL — ABNORMAL HIGH (ref 1.7–7.7)
Neutrophils Relative %: 78 %
Platelets: 389 10*3/uL (ref 150–400)
RBC: 4.68 MIL/uL (ref 3.87–5.11)
RDW: 13 % (ref 11.5–15.5)
WBC: 11.8 10*3/uL — ABNORMAL HIGH (ref 4.0–10.5)
nRBC: 0 % (ref 0.0–0.2)

## 2019-10-05 LAB — GLUCOSE, CAPILLARY
Glucose-Capillary: 94 mg/dL (ref 70–99)
Glucose-Capillary: 116 mg/dL — ABNORMAL HIGH (ref 70–99)
Glucose-Capillary: 149 mg/dL — ABNORMAL HIGH (ref 70–99)
Glucose-Capillary: 186 mg/dL — ABNORMAL HIGH (ref 70–99)

## 2019-10-05 LAB — D-DIMER, QUANTITATIVE: D-Dimer, Quant: 0.59 ug/mL-FEU — ABNORMAL HIGH (ref 0.00–0.50)

## 2019-10-05 LAB — FERRITIN: Ferritin: 567 ng/mL — ABNORMAL HIGH (ref 11–307)

## 2019-10-05 NOTE — Progress Notes (Signed)
PROGRESS NOTE    Taylor Ho  VEL:[id number] DOB: 01/19/1966 DOA: 10/02/2019 PCP: Rochel Brome, MD    Brief Narrative:  54 year old with history of hypothyroidism and migraine presented to [facility] emergency room on 1/3 with complaints of shortness of breath.  Diagnosed with COVID-19 a week prior.  In the emergency room, she was mildly hypoxic requiring 3 L nasal cannula oxygen.  Chest x-ray with bilateral opacities.  Given remdesivir and IV Decadron.  Oxygen requirements worsening.  Transfer to [employer] on 15 L oxygen. Oxygen requirements gradually improving.   Assessment & Plan:   Principal Problem:   Acute respiratory failure with hypoxia (HCC) Active Problems:   Migraine headache   Hypothyroidism   COVID-19 virus infection   AKI (acute kidney injury) (HCC)   Obesity (BMI 30-39.9)  Acute respiratory failure with hypoxia, pneumonia due to COVID-19 virus: Continue to monitor due to significant symptoms, still on significant oxygen.  Able to wean down to 6 L oxygen today. chest physiotherapy, incentive spirometry, deep breathing exercises, sputum induction, mucolytic's and bronchodilators. Supplemental oxygen to keep saturations more than 90%. Covid directed therapy with , steroids, on dexamethasone that we will continue remdesivir, day 5/5 actemra, given 1 dose at [facility] antibiotics, not indicated Due to severity of symptoms, patient will need daily inflammatory markers, liver function test to monitor and direct COVID-19 therapies.  Hypothyroidism: Clinically euthyroid on Synthroid replacement.  Acute kidney injury: Normalizing.   DVT prophylaxis: Lovenox subcu Code Status: Full code Family Communication: None.  Patient communicating with family. Disposition Plan: Home after improvement.  Anticipate tomorrow.   Consultants:   None  Procedures:   None  Antimicrobials:  Anti-infectives (From admission, onward)   Start     Dose/Rate  Route Frequency Ordered Stop   10/03/19 1000  remdesivir 100 mg in sodium chloride 0.9 % 100 mL IVPB     100 mg 200 mL/hr over 30 Minutes Intravenous Daily 10/02/19 1433 10/05/19 0954   10/02/19 1600  remdesivir 100 mg in sodium chloride 0.9 % 100 mL IVPB     100 mg 200 mL/hr over 30 Minutes Intravenous  Once 10/02/19 1433 10/02/19 1753         Subjective: Seen and examined.  No overnight events.  She feels like her breathing might be slightly better today.  Objective: Vitals:   10/04/19 2000 10/05/19 0051 10/05/19 0400 10/05/19 0800  BP: 117/64 (!) 130/59 100/64 102/68  Pulse: (!) 58 (!) 52 (!) 57 (!) 49  Resp: 18 (!) 25 19 (!) 25  Temp: 98.7 F (37.1 C) 98.6 F (37 C) 98.1 F (36.7 C) 98.2 F (36.8 C)  TempSrc: Oral Oral Oral Oral  SpO2: 96% 97% 96% 94%  Weight:      Height:        Intake/Output Summary (Last 24 hours) at 10/05/2019 1346 Last data filed at 10/04/2019 1715 Gross per 24 hour  Intake 480 ml  Output 250 ml  Net 230 ml   Filed Weights   10/02/19 1400 10/02/19 1401  Weight: 99 kg 99.2 kg    Examination:  General exam: Appears calm and comfortable on 6 L nasal cannula. Respiratory system: Clear to auscultation. Respiratory effort normal.  No added sounds. Cardiovascular system: S1 & S2 heard, RRR. No JVD, murmurs, rubs, gallops or clicks. No pedal edema. Gastrointestinal system: Abdomen is nondistended, soft and nontender. No organomegaly or masses felt. Normal bowel sounds heard. Central nervous system: Alert and oriented. No focal neurological  deficits. Extremities: Symmetric 5 x 5 power. Skin: No rashes, lesions or ulcers Psychiatry: Judgement and insight appear normal. Mood & affect appropriate.     Data Reviewed: I have personally reviewed following labs and imaging studies  CBC: Recent Labs  Lab 10/02/19 1510 10/04/19 0750 10/05/19 0600  WBC 5.0 12.6* 11.8*  NEUTROABS  --  9.9* 9.3*  HGB 14.5 15.1* 13.2  HCT 44.8 48.5* 41.7  MCV 88.0  88.7 89.1  PLT 277 449* 389   Basic Metabolic Panel: Recent Labs  Lab 10/02/19 1510 10/04/19 0750 10/05/19 0600  NA  --  142 138  K  --  4.7 4.7  CL  --  107 107  CO2  --  23 22  GLUCOSE  --  131* 110*  BUN  --  38* 36*  CREATININE 1.58* 1.35* 1.28*  CALCIUM  --  8.9 8.4*   GFR: Estimated Creatinine Clearance: 60.4 mL/min (A) (by C-G formula based on SCr of 1.28 mg/dL (H)). Liver Function Tests: Recent Labs  Lab 10/04/19 0750 10/05/19 0600  AST 52* 37  ALT 77* 65*  ALKPHOS 98 77  BILITOT 0.8 0.4  PROT 7.5 6.4*  ALBUMIN 3.3* 2.8*   No results for input(s): LIPASE, AMYLASE in the last 168 hours. No results for input(s): AMMONIA in the last 168 hours. Coagulation Profile: No results for input(s): INR, PROTIME in the last 168 hours. Cardiac Enzymes: No results for input(s): CKTOTAL, CKMB, CKMBINDEX, TROPONINI in the last 168 hours. BNP (last 3 results) No results for input(s): PROBNP in the last 8760 hours. HbA1C: Recent Labs    10/04/19 0750  HGBA1C 6.0*   CBG: Recent Labs  Lab 10/04/19 1156 10/04/19 1616 10/04/19 2049 10/05/19 0750 10/05/19 1213  GLUCAP 143* 182* 151* 94 116*   Lipid Profile: No results for input(s): CHOL, HDL, LDLCALC, TRIG, CHOLHDL, LDLDIRECT in the last 72 hours. Thyroid Function Tests: No results for input(s): TSH, T4TOTAL, FREET4, T3FREE, THYROIDAB in the last 72 hours. Anemia Panel: Recent Labs    10/04/19 0750 10/05/19 0600  FERRITIN 783* 567*   Sepsis Labs: No results for input(s): PROCALCITON, LATICACIDVEN in the last 168 hours.  No results found for this or any previous visit (from the past 240 hour(s)).       Radiology Studies: DG CHEST PORT 1 VIEW  Result Date: 10/05/2019 CLINICAL DATA:  Pneumonia EXAM: PORTABLE CHEST 1 VIEW COMPARISON:  10/01/2019 chest radiograph. FINDINGS: Slightly low lung volumes. Stable cardiomediastinal silhouette with borderline cardiomegaly. No pneumothorax. No pleural effusion.  Previously visualized patchy opacities in the lower right lung have improved. Patchy opacities in the lower left lung appears similar to slightly worsened. New patchy opacities in the apical right lung. IMPRESSION: Persistent patchy opacities in both lungs with mixed interval changes, favoring pneumonia, although a component of pulmonary edema is not excluded given the borderline cardiomegaly. Electronically Signed   By: Delbert Phenix M.D.   On: 10/05/2019 09:33        Scheduled Meds: . dexamethasone (DECADRON) injection  6 mg Intravenous Q24H  . enoxaparin (LOVENOX) injection  50 mg Subcutaneous Q24H  . feeding supplement  1 Container Oral TID BM  . insulin aspart  0-9 Units Subcutaneous TID WC  . levothyroxine  100 mcg Oral Q0600  . nadolol  40 mg Oral Daily  . sodium chloride flush  3 mL Intravenous Q12H   Continuous Infusions:    LOS: 3 days     Time spent: 25 minutes  Barb Merino, MD [facility] Pager [phone]

## 2019-10-06 LAB — CBC WITH DIFFERENTIAL/PLATELET
Abs Immature Granulocytes: 0.48 10*3/uL — ABNORMAL HIGH (ref 0.00–0.07)
Basophils Absolute: 0.1 10*3/uL (ref 0.0–0.1)
Basophils Relative: 1 %
Eosinophils Absolute: 0 10*3/uL (ref 0.0–0.5)
Eosinophils Relative: 0 %
HCT: 45.5 % (ref 36.0–46.0)
Hemoglobin: 14.9 g/dL (ref 12.0–15.0)
Immature Granulocytes: 4 %
Lymphocytes Relative: 10 %
Lymphs Abs: 1.1 10*3/uL (ref 0.7–4.0)
MCH: 28.2 pg (ref 26.0–34.0)
MCHC: 32.7 g/dL (ref 30.0–36.0)
MCV: 86.2 fL (ref 80.0–100.0)
Monocytes Absolute: 0.6 10*3/uL (ref 0.1–1.0)
Monocytes Relative: 6 %
Neutro Abs: 8.8 10*3/uL — ABNORMAL HIGH (ref 1.7–7.7)
Neutrophils Relative %: 79 %
Platelets: 393 10*3/uL (ref 150–400)
RBC: 5.28 MIL/uL — ABNORMAL HIGH (ref 3.87–5.11)
RDW: 12.8 % (ref 11.5–15.5)
WBC: 11 10*3/uL — ABNORMAL HIGH (ref 4.0–10.5)
nRBC: 0 % (ref 0.0–0.2)

## 2019-10-06 LAB — COMPREHENSIVE METABOLIC PANEL
ALT: 62 U/L — ABNORMAL HIGH (ref 0–44)
AST: 27 U/L (ref 15–41)
Albumin: 2.9 g/dL — ABNORMAL LOW (ref 3.5–5.0)
Alkaline Phosphatase: 73 U/L (ref 38–126)
Anion gap: 9 (ref 5–15)
BUN: 37 mg/dL — ABNORMAL HIGH (ref 6–20)
CO2: 24 mmol/L (ref 22–32)
Calcium: 8.3 mg/dL — ABNORMAL LOW (ref 8.9–10.3)
Chloride: 105 mmol/L (ref 98–111)
Creatinine, Ser: 1.28 mg/dL — ABNORMAL HIGH (ref 0.44–1.00)
GFR calc Af Amer: 55 mL/min — ABNORMAL LOW (ref >60)
GFR calc non Af Amer: 48 mL/min — ABNORMAL LOW (ref >60)
Glucose, Bld: 129 mg/dL — ABNORMAL HIGH (ref 70–99)
Potassium: 4.5 mmol/L (ref 3.5–5.1)
Sodium: 138 mmol/L (ref 135–145)
Total Bilirubin: 1 mg/dL (ref 0.3–1.2)
Total Protein: 6.5 g/dL (ref 6.5–8.1)

## 2019-10-06 LAB — GLUCOSE, CAPILLARY
Glucose-Capillary: 110 mg/dL — ABNORMAL HIGH (ref 70–99)
Glucose-Capillary: 112 mg/dL — ABNORMAL HIGH (ref 70–99)
Glucose-Capillary: 176 mg/dL — ABNORMAL HIGH (ref 70–99)
Glucose-Capillary: 178 mg/dL — ABNORMAL HIGH (ref 70–99)

## 2019-10-06 LAB — FERRITIN: Ferritin: 516 ng/mL — ABNORMAL HIGH (ref 11–307)

## 2019-10-06 LAB — D-DIMER, QUANTITATIVE: D-Dimer, Quant: 0.56 ug/mL-FEU — ABNORMAL HIGH (ref 0.00–0.50)

## 2019-10-06 NOTE — Progress Notes (Signed)
PROGRESS NOTE    Taylor Ho  BJY:[id number] DOB: 09-12-1966 DOA: 10/02/2019 PCP: Blane Ohara, MD    Brief Narrative:  54 year old with history of hypothyroidism and migraine presented to [facility] emergency room on 1/3 with complaints of shortness of breath.  Diagnosed with COVID-19 a week prior.  In the emergency room, she was mildly hypoxic requiring 3 L nasal cannula oxygen.  Chest x-ray with bilateral opacities.  Given remdesivir and IV Decadron.  Oxygen requirements worsening.  Transfer to [employer] on 15 L oxygen. Oxygen requirements gradually improving.   Assessment & Plan:   Principal Problem:   Acute respiratory failure with hypoxia (HCC) Active Problems:   Migraine headache   Hypothyroidism   COVID-19 virus infection   AKI (acute kidney injury) (HCC)   Obesity (BMI 30-39.9)  Acute respiratory failure with hypoxia, pneumonia due to COVID-19 virus: Continue to monitor due to significant symptoms, still on supplemental oxygen.  Continue to wean.  Continue chest physiotherapy, incentive spirometry, deep breathing exercises, sputum induction, mucolytic's and bronchodilators. Supplemental oxygen to keep saturations more than 90%. Covid directed therapy with , steroids, on dexamethasone that we will continue for total 10 days. remdesivir, finished therapy. actemra, given 1 dose at [facility] antibiotics, not indicated Gradually improving.  He still on supplemental oxygen.  Hypothyroidism: Clinically euthyroid on Synthroid replacement.  Acute kidney injury: Normalizing.   DVT prophylaxis: Lovenox subcu Code Status: Full code Family Communication: None.  Patient communicating with family. Disposition Plan: Home after improvement.  Anticipate after improvement of oxygen requirement.   Consultants:   None  Procedures:   None  Antimicrobials:  Anti-infectives (From admission, onward)   Start     Dose/Rate Route Frequency Ordered Stop    10/03/19 1000  remdesivir 100 mg in sodium chloride 0.9 % 100 mL IVPB     100 mg 200 mL/hr over 30 Minutes Intravenous Daily 10/02/19 1433 10/05/19 1900   10/02/19 1600  remdesivir 100 mg in sodium chloride 0.9 % 100 mL IVPB     100 mg 200 mL/hr over 30 Minutes Intravenous  Once 10/02/19 1433 10/02/19 1753         Subjective: Patient seen and examined.  Better breathing today.  She wants to go home today.  On 4 L oxygen.  Afebrile.  Objective: Vitals:   10/06/19 0200 10/06/19 0300 10/06/19 0400 10/06/19 0800  BP:   107/61 (!) 93/41  Pulse: (!) 46 (!) 48 (!) 53 62  Resp: 19 (!) 24 (!) 26 19  Temp:   98.5 F (36.9 C) 97.8 F (36.6 C)  TempSrc:   Oral Oral  SpO2: 99% 98% 98% 92%  Weight:      Height:        Intake/Output Summary (Last 24 hours) at 10/06/2019 1230 Last data filed at 10/05/2019 2000 Gross per 24 hour  Intake 240 ml  Output 600 ml  Net -360 ml   Filed Weights   10/02/19 1400 10/02/19 1401  Weight: 99 kg 99.2 kg    Examination:  General exam: Appears calm and comfortable on 4 L nasal cannula. Respiratory system: Clear to auscultation. Respiratory effort normal.  No added sounds. Cardiovascular system: S1 & S2 heard, RRR. No JVD, murmurs, rubs, gallops or clicks. No pedal edema. Gastrointestinal system: Abdomen is nondistended, soft and nontender. No organomegaly or masses felt. Normal bowel sounds heard. Central nervous system: Alert and oriented. No focal neurological deficits. Extremities: Symmetric 5 x 5 power. Skin: No rashes, lesions  or ulcers Psychiatry: Judgement and insight appear normal. Mood & affect appropriate.     Data Reviewed: I have personally reviewed following labs and imaging studies  CBC: Recent Labs  Lab 10/02/19 1510 10/04/19 0750 10/05/19 0600 10/06/19 0132  WBC 5.0 12.6* 11.8* 11.0*  NEUTROABS  --  9.9* 9.3* 8.8*  HGB 14.5 15.1* 13.2 14.9  HCT 44.8 48.5* 41.7 45.5  MCV 88.0 88.7 89.1 86.2  PLT 277 449* 389 393   Basic  Metabolic Panel: Recent Labs  Lab 10/02/19 1510 10/04/19 0750 10/05/19 0600 10/06/19 0132  NA  --  142 138 138  K  --  4.7 4.7 4.5  CL  --  107 107 105  CO2  --  23 22 24   GLUCOSE  --  131* 110* 129*  BUN  --  38* 36* 37*  CREATININE 1.58* 1.35* 1.28* 1.28*  CALCIUM  --  8.9 8.4* 8.3*   GFR: Estimated Creatinine Clearance: 60.4 mL/min (A) (by C-G formula based on SCr of 1.28 mg/dL (H)). Liver Function Tests: Recent Labs  Lab 10/04/19 0750 10/05/19 0600 10/06/19 0132  AST 52* 37 27  ALT 77* 65* 62*  ALKPHOS 98 77 73  BILITOT 0.8 0.4 1.0  PROT 7.5 6.4* 6.5  ALBUMIN 3.3* 2.8* 2.9*   No results for input(s): LIPASE, AMYLASE in the last 168 hours. No results for input(s): AMMONIA in the last 168 hours. Coagulation Profile: No results for input(s): INR, PROTIME in the last 168 hours. Cardiac Enzymes: No results for input(s): CKTOTAL, CKMB, CKMBINDEX, TROPONINI in the last 168 hours. BNP (last 3 results) No results for input(s): PROBNP in the last 8760 hours. HbA1C: Recent Labs    10/04/19 0750  HGBA1C 6.0*   CBG: Recent Labs  Lab 10/05/19 1213 10/05/19 1538 10/05/19 2000 10/06/19 0719 10/06/19 1124  GLUCAP 116* 149* 186* 110* 112*   Lipid Profile: No results for input(s): CHOL, HDL, LDLCALC, TRIG, CHOLHDL, LDLDIRECT in the last 72 hours. Thyroid Function Tests: No results for input(s): TSH, T4TOTAL, FREET4, T3FREE, THYROIDAB in the last 72 hours. Anemia Panel: Recent Labs    10/05/19 0600 10/06/19 0132  FERRITIN 567* 516*   Sepsis Labs: No results for input(s): PROCALCITON, LATICACIDVEN in the last 168 hours.  No results found for this or any previous visit (from the past 240 hour(s)).       Radiology Studies: DG CHEST PORT 1 VIEW  Result Date: 10/05/2019 CLINICAL DATA:  Pneumonia EXAM: PORTABLE CHEST 1 VIEW COMPARISON:  10/01/2019 chest radiograph. FINDINGS: Slightly low lung volumes. Stable cardiomediastinal silhouette with borderline  cardiomegaly. No pneumothorax. No pleural effusion. Previously visualized patchy opacities in the lower right lung have improved. Patchy opacities in the lower left lung appears similar to slightly worsened. New patchy opacities in the apical right lung. IMPRESSION: Persistent patchy opacities in both lungs with mixed interval changes, favoring pneumonia, although a component of pulmonary edema is not excluded given the borderline cardiomegaly. Electronically Signed   By: 11/29/2019 M.D.   On: 10/05/2019 09:33        Scheduled Meds: . dexamethasone (DECADRON) injection  6 mg Intravenous Q24H  . enoxaparin (LOVENOX) injection  50 mg Subcutaneous Q24H  . feeding supplement  1 Container Oral TID BM  . insulin aspart  0-9 Units Subcutaneous TID WC  . levothyroxine  100 mcg Oral Q0600  . nadolol  40 mg Oral Daily  . sodium chloride flush  3 mL Intravenous Q12H   Continuous Infusions:  LOS: 4 days     Time spent: 25 minutes    Barb Merino, MD [facility] Pager [phone]

## 2019-10-06 NOTE — Plan of Care (Signed)
No acute events during this shift. Pt remains on 6L HFNC with no reports of SOB or pain. Purewick in place. Pt advised to move around the room to help with breathing. Pt stated she will do this today. Pt has no complaints at this time. Call bell within reach, encouraged to use call bell for assistance. VSS and WDL for this pt.    Problem: Education: Goal: Knowledge of risk factors and measures for prevention of condition will improve Outcome: Progressing   Problem: Coping: Goal: Psychosocial and spiritual needs will be supported Outcome: Progressing   Problem: Respiratory: Goal: Will maintain a patent airway Outcome: Progressing Goal: Complications related to the disease process, condition or treatment will be avoided or minimized Outcome: Progressing

## 2019-10-06 NOTE — Progress Notes (Signed)
   Patient Saturations on Room Air at Rest = 79%   Patient Saturations on 4 Liters of oxygen while Ambulating = 89%  Please briefly explain why patient needs home oxygen: Patient stating 99% on 4 liters at rest, but drops to 79 on room air and 92% after ambulating 15 feet

## 2019-10-07 LAB — COMPREHENSIVE METABOLIC PANEL
ALT: 47 U/L — ABNORMAL HIGH (ref 0–44)
AST: 19 U/L (ref 15–41)
Albumin: 3 g/dL — ABNORMAL LOW (ref 3.5–5.0)
Alkaline Phosphatase: 67 U/L (ref 38–126)
Anion gap: 9 (ref 5–15)
BUN: 36 mg/dL — ABNORMAL HIGH (ref 6–20)
CO2: 24 mmol/L (ref 22–32)
Calcium: 8.4 mg/dL — ABNORMAL LOW (ref 8.9–10.3)
Chloride: 105 mmol/L (ref 98–111)
Creatinine, Ser: 1.11 mg/dL — ABNORMAL HIGH (ref 0.44–1.00)
GFR calc Af Amer: >60 mL/min (ref >60)
GFR calc non Af Amer: 57 mL/min — ABNORMAL LOW (ref >60)
Glucose, Bld: 124 mg/dL — ABNORMAL HIGH (ref 70–99)
Potassium: 4.6 mmol/L (ref 3.5–5.1)
Sodium: 138 mmol/L (ref 135–145)
Total Bilirubin: 0.8 mg/dL (ref 0.3–1.2)
Total Protein: 6.3 g/dL — ABNORMAL LOW (ref 6.5–8.1)

## 2019-10-07 LAB — GLUCOSE, CAPILLARY
Glucose-Capillary: 88 mg/dL (ref 70–99)
Glucose-Capillary: 110 mg/dL — ABNORMAL HIGH (ref 70–99)

## 2019-10-07 LAB — CBC WITH DIFFERENTIAL/PLATELET
Abs Immature Granulocytes: 0.61 10*3/uL — ABNORMAL HIGH (ref 0.00–0.07)
Basophils Absolute: 0.1 10*3/uL (ref 0.0–0.1)
Basophils Relative: 1 %
Eosinophils Absolute: 0 10*3/uL (ref 0.0–0.5)
Eosinophils Relative: 0 %
HCT: 43.5 % (ref 36.0–46.0)
Hemoglobin: 14.5 g/dL (ref 12.0–15.0)
Immature Granulocytes: 4 %
Lymphocytes Relative: 10 %
Lymphs Abs: 1.4 10*3/uL (ref 0.7–4.0)
MCH: 28.2 pg (ref 26.0–34.0)
MCHC: 33.3 g/dL (ref 30.0–36.0)
MCV: 84.6 fL (ref 80.0–100.0)
Monocytes Absolute: 0.9 10*3/uL (ref 0.1–1.0)
Monocytes Relative: 7 %
Neutro Abs: 10.8 10*3/uL — ABNORMAL HIGH (ref 1.7–7.7)
Neutrophils Relative %: 78 %
Platelets: 414 10*3/uL — ABNORMAL HIGH (ref 150–400)
RBC: 5.14 MIL/uL — ABNORMAL HIGH (ref 3.87–5.11)
RDW: 12.5 % (ref 11.5–15.5)
WBC: 13.8 10*3/uL — ABNORMAL HIGH (ref 4.0–10.5)
nRBC: 0 % (ref 0.0–0.2)

## 2019-10-07 MED ORDER — DEXAMETHASONE 6 MG PO TABS: 6.0000 mg | ORAL_TABLET | Freq: Every day | ORAL | 0 refills | Status: AC

## 2019-10-07 NOTE — Progress Notes (Signed)
BP is soft this AM. 78/55 map 64. pt states she feels fine and is ready to go home this morning. turned off 02 this morning sats dropped to 82-85ish. placed back on 2 L [place]. Notified MD K. Ghimire

## 2019-10-07 NOTE — Progress Notes (Signed)
reviewed AVS with pt. All questions answered. 02 and pulse ox provied for pt

## 2019-10-07 NOTE — Discharge Summary (Signed)
Physician Discharge Summary  Taylor Ho QBH:[id number] DOB: [birth year] DOA: 10/02/2019  PCP: Blane Ohara, MD  Admit date: 10/02/2019 Discharge date: 10/07/2019  Admitted From: Home Disposition: Home  Recommendations for Outpatient Follow-up:  1. Follow up with PCP in 1-2 weeks   Home Health: Not applicable Equipment/Devices: Oxygen  Discharge Condition: Stable CODE STATUS: Full code Diet recommendation: Regular diet  Discharge summary: 54 year old with history of hypothyroidism and migraine presented to [facility] emergency room on 1/3 with complaints of shortness of breath.  Diagnosed with COVID-19 a week prior.  In the emergency room, she was mildly hypoxic requiring 3 L nasal cannula oxygen.  Chest x-ray with bilateral opacities.  Given remdesivir and IV Decadron.  Oxygen requirements worsening.  Transfer to [employer] on 15 L oxygen.  Patient was admitted to hospital and treated with aggressive chest physiotherapy, bronchodilator therapy, IV steroids and remdesivir for 5 days.  Also received 1 dose of Actemra on admission at [facility].  Her symptoms gradually improved.  Mostly stabilized with no evidence of complication, however patient was still on 2 to 3 L of oxygen.  With clinical stabilization and patient's wishes to go home and able to manage oxygen at home, she was discharged home with supplemental oxygen on exertion.  She will need outpatient follow-up and hopefully she will come off the oxygen in future. Dexamethasone was prescribed to finish 10 days of therapy.  Discharge Diagnoses:  Principal Problem:   Acute respiratory failure with hypoxia (HCC) Active Problems:   Migraine headache   Hypothyroidism   COVID-19 virus infection   AKI (acute kidney injury) (HCC)   Obesity (BMI 30-39.9)    Discharge Instructions  Discharge Instructions    Diet - low sodium heart healthy   Complete by: As directed    Increase activity slowly   Complete by: As  directed      Allergies as of 10/07/2019   No Known Allergies     Medication List    TAKE these medications   dexamethasone 6 MG tablet Commonly known as: DECADRON Take 1 tablet (6 mg total) by mouth daily for 3 days.   levothyroxine 112 MCG tablet Commonly known as: SYNTHROID Take 112 mcg by mouth daily.   meloxicam 7.5 MG tablet Commonly known as: MOBIC Take 7.5 mg by mouth daily.   nadolol 80 MG tablet Commonly known as: CORGARD Take 80 mg daily by mouth.   SUMAtriptan 100 MG tablet Commonly known as: IMITREX Take 100 mg every 2 (two) hours as needed by mouth for migraine. May repeat in 2 hours if headache persists or recurs.            Durable Medical Equipment  (From admission, onward)         Start     Ordered   10/07/19 1134  DME Oxygen  Once    Comments: Patient Saturations on Room Air at Rest = 84%  Patient Saturations on [employer] while Ambulating = *81%  Patient Saturations on 4 Liters of oxygen while Ambulating = 91%  Question Answer Comment  Length of Need 12 Months   Mode or (Route) Nasal cannula   Liters per Minute 4   Frequency Continuous (stationary and portable oxygen unit needed)   Oxygen conserving device Yes   Oxygen delivery system Gas      10/07/19 1134          No Known Allergies  Consultations:  None   Procedures/Studies: DG CHEST PORT 1  VIEW  Result Date: 10/05/2019 CLINICAL DATA:  Pneumonia EXAM: PORTABLE CHEST 1 VIEW COMPARISON:  10/01/2019 chest radiograph. FINDINGS: Slightly low lung volumes. Stable cardiomediastinal silhouette with borderline cardiomegaly. No pneumothorax. No pleural effusion. Previously visualized patchy opacities in the lower right lung have improved. Patchy opacities in the lower left lung appears similar to slightly worsened. New patchy opacities in the apical right lung. IMPRESSION: Persistent patchy opacities in both lungs with mixed interval changes, favoring pneumonia, although a component of  pulmonary edema is not excluded given the borderline cardiomegaly. Electronically Signed   By: Delbert Phenix M.D.   On: 10/05/2019 09:33   VAS [place] LOWER EXTREMITY VENOUS (DVT)  Result Date: 10/03/2019  Lower Venous Study Indications: Hypoxia, COVID-19 positive.  Limitations: Poor ultrasound/tissue interface and body habitus. Comparison Study: No prior study. Performing Technologist: Gertie Fey RDMS, RVT, RDCS  Examination Guidelines: A complete evaluation includes B-mode imaging, spectral Doppler, color Doppler, and power Doppler as needed of all accessible portions of each vessel. Bilateral testing is considered an integral part of a complete examination. Limited examinations for reoccurring indications may be performed as noted.  +---------+---------------+---------+-----------+----------+--------------+ RIGHT    CompressibilityPhasicitySpontaneityPropertiesThrombus Aging +---------+---------------+---------+-----------+----------+--------------+ CFV      Full           Yes      Yes                                 +---------+---------------+---------+-----------+----------+--------------+ SFJ      Full                                                        +---------+---------------+---------+-----------+----------+--------------+ FV Prox  Full                                                        +---------+---------------+---------+-----------+----------+--------------+ FV Mid   Full                                                        +---------+---------------+---------+-----------+----------+--------------+ FV DistalFull                                                        +---------+---------------+---------+-----------+----------+--------------+ PFV      Full                                                        +---------+---------------+---------+-----------+----------+--------------+ POP      Full           Yes      Yes                                  +---------+---------------+---------+-----------+----------+--------------+  PTV      Full                                                        +---------+---------------+---------+-----------+----------+--------------+ PERO     Full                                                        +---------+---------------+---------+-----------+----------+--------------+   +---------+---------------+---------+-----------+----------+--------------+ LEFT     CompressibilityPhasicitySpontaneityPropertiesThrombus Aging +---------+---------------+---------+-----------+----------+--------------+ CFV      Full           Yes      Yes                                 +---------+---------------+---------+-----------+----------+--------------+ SFJ      Full                                                        +---------+---------------+---------+-----------+----------+--------------+ FV Prox  Full                                                        +---------+---------------+---------+-----------+----------+--------------+ FV Mid   Full                                                        +---------+---------------+---------+-----------+----------+--------------+ FV DistalFull                                                        +---------+---------------+---------+-----------+----------+--------------+ PFV      Full                                                        +---------+---------------+---------+-----------+----------+--------------+ POP      Full           Yes      Yes                                 +---------+---------------+---------+-----------+----------+--------------+ PTV      Full                                                        +---------+---------------+---------+-----------+----------+--------------+  PERO                                                  Not visualized  +---------+---------------+---------+-----------+----------+--------------+     Summary: Right: There is no evidence of deep vein thrombosis in the lower extremity. No cystic structure found in the popliteal fossa. Left: There is no evidence of deep vein thrombosis in the lower extremity. However, portions of this examination were limited- see technologist comments above. No cystic structure found in the popliteal fossa.  *See table(s) above for measurements and observations. Electronically signed by Coral Else MD on 10/03/2019 at 1:44:19 PM.    Final        Subjective: Patient seen and examined.  No overnight events.  At rest she is not on oxygen, however on minimal ambulation, needing supplemental oxygen to keep saturations more than 89%. Feels better.  She thinks she can go home and recover. In the morning, blood pressures recorded low, however normal on repeat testing.   Discharge Exam: Vitals:   10/07/19 0800 10/07/19 0900  BP:  91/68  Pulse:    Resp:    Temp:  97.7 F (36.5 C)  SpO2: 91% 90%   Vitals:   10/06/19 2030 10/07/19 0727 10/07/19 0800 10/07/19 0900  BP: 124/73 (!) 78/55  91/68  Pulse:  62    Resp: 18 18    Temp: 98.1 F (36.7 C) 97.7 F (36.5 C)  97.7 F (36.5 C)  TempSrc: Oral Oral  Oral  SpO2: 94% (!) 85% 91% 90%  Weight:      Height:        General: Pt is alert, awake, not in acute distress, on 3 L oxygen. Cardiovascular: RRR, S1/S2 +, no rubs, no gallops Respiratory: CTA bilaterally, no wheezing, no rhonchi, no added sounds. Abdominal: Soft, NT, ND, bowel sounds + Extremities: no edema, no cyanosis    The results of significant diagnostics from this hospitalization (including imaging, microbiology, ancillary and laboratory) are listed below for reference.     Microbiology: No results found for this or any previous visit (from the past 240 hour(s)).   Labs: BNP (last 3 results) No results for input(s): BNP in the last 8760 hours. Basic  Metabolic Panel: Recent Labs  Lab 10/02/19 1510 10/04/19 0750 10/05/19 0600 10/06/19 0132 10/07/19 0151  NA  --  142 138 138 138  K  --  4.7 4.7 4.5 4.6  CL  --  107 107 105 105  CO2  --  23 22 24 24   GLUCOSE  --  131* 110* 129* 124*  BUN  --  38* 36* 37* 36*  CREATININE 1.58* 1.35* 1.28* 1.28* 1.11*  CALCIUM  --  8.9 8.4* 8.3* 8.4*   Liver Function Tests: Recent Labs  Lab 10/04/19 0750 10/05/19 0600 10/06/19 0132 10/07/19 0151  AST 52* 37 27 19  ALT 77* 65* 62* 47*  ALKPHOS 98 77 73 67  BILITOT 0.8 0.4 1.0 0.8  PROT 7.5 6.4* 6.5 6.3*  ALBUMIN 3.3* 2.8* 2.9* 3.0*   No results for input(s): LIPASE, AMYLASE in the last 168 hours. No results for input(s): AMMONIA in the last 168 hours. CBC: Recent Labs  Lab 10/02/19 1510 10/04/19 0750 10/05/19 0600 10/06/19 0132 10/07/19 0151  WBC 5.0 12.6* 11.8* 11.0* 13.8*  NEUTROABS  --  9.9* 9.3* 8.8* 10.8*  HGB 14.5 15.1* 13.2 14.9 14.5  HCT 44.8 48.5* 41.7 45.5 43.5  MCV 88.0 88.7 89.1 86.2 84.6  PLT 277 449* 389 393 414*   Cardiac Enzymes: No results for input(s): CKTOTAL, CKMB, CKMBINDEX, TROPONINI in the last 168 hours. BNP: Invalid input(s): POCBNP CBG: Recent Labs  Lab 10/06/19 0719 10/06/19 1124 10/06/19 1553 10/06/19 2114 10/07/19 0726  GLUCAP 110* 112* 176* 178* 88   D-Dimer Recent Labs    10/05/19 0600 10/06/19 0132  DDIMER 0.59* 0.56*   Hgb A1c No results for input(s): HGBA1C in the last 72 hours. Lipid Profile No results for input(s): CHOL, HDL, LDLCALC, TRIG, CHOLHDL, LDLDIRECT in the last 72 hours. Thyroid function studies No results for input(s): TSH, T4TOTAL, T3FREE, THYROIDAB in the last 72 hours.  Invalid input(s): FREET3 Anemia work up Recent Labs    10/05/19 0600 10/06/19 0132  FERRITIN 567* 516*   Urinalysis No results found for: COLORURINE, APPEARANCEUR, LABSPEC, [place], GLUCOSEU, HGBUR, BILIRUBINUR, KETONESUR, PROTEINUR, UROBILINOGEN, NITRITE, LEUKOCYTESUR Sepsis  Labs Invalid input(s): PROCALCITONIN,  WBC,  LACTICIDVEN Microbiology No results found for this or any previous visit (from the past 240 hour(s)).   Time coordinating discharge:  40 minutes  SIGNED:   Barb Merino, MD  [facility] 10/07/2019, 11:35 AM

## 2019-10-07 NOTE — Clinical Social Work Note (Addendum)
02 has been set up through [employer]--awaiting conformation that its been delivered. 02 will be delivered to pt's home. Pt will need 02 from supply closet to transport home.  [place], [place] [phone]

## 2019-10-07 NOTE — Progress Notes (Signed)
SATURATION QUALIFICATIONS: (This note is used to comply with regulatory documentation for home oxygen)  Patient Saturations on Room Air at Rest = 84%  Patient Saturations on Room Air while Ambulating = *81%  Patient Saturations on 4 Liters of oxygen while Ambulating = 91%  Please briefly explain why patient needs home oxygen:

## 2019-10-19 DIAGNOSIS — J9601 Acute respiratory failure with hypoxia: Secondary | ICD-10-CM | POA: Diagnosis not present

## 2019-10-19 DIAGNOSIS — J80 Acute respiratory distress syndrome: Secondary | ICD-10-CM | POA: Diagnosis not present

## 2019-10-19 DIAGNOSIS — R519 Headache, unspecified: Secondary | ICD-10-CM | POA: Diagnosis not present

## 2019-10-19 DIAGNOSIS — U071 COVID-19: Secondary | ICD-10-CM | POA: Diagnosis not present

## 2019-10-25 ENCOUNTER — Other Ambulatory Visit: Payer: Self-pay

## 2019-11-07 DIAGNOSIS — U071 COVID-19: Secondary | ICD-10-CM | POA: Diagnosis not present

## 2019-11-16 ENCOUNTER — Encounter: Payer: Self-pay | Admitting: Family Medicine

## 2019-11-16 ENCOUNTER — Telehealth (INDEPENDENT_AMBULATORY_CARE_PROVIDER_SITE_OTHER): Payer: BC Managed Care – PPO | Admitting: Family Medicine

## 2019-11-16 VITALS — BP 120/60 | HR 98 | Wt 218.0 lb

## 2019-11-16 DIAGNOSIS — R0600 Dyspnea, unspecified: Secondary | ICD-10-CM | POA: Diagnosis not present

## 2019-11-16 DIAGNOSIS — E785 Hyperlipidemia, unspecified: Secondary | ICD-10-CM

## 2019-11-16 DIAGNOSIS — U071 COVID-19: Secondary | ICD-10-CM

## 2019-11-16 DIAGNOSIS — E039 Hypothyroidism, unspecified: Secondary | ICD-10-CM | POA: Diagnosis not present

## 2019-11-16 DIAGNOSIS — G43909 Migraine, unspecified, not intractable, without status migrainosus: Secondary | ICD-10-CM | POA: Diagnosis not present

## 2019-11-16 DIAGNOSIS — G43019 Migraine without aura, intractable, without status migrainosus: Secondary | ICD-10-CM

## 2019-11-16 DIAGNOSIS — E782 Mixed hyperlipidemia: Secondary | ICD-10-CM | POA: Insufficient documentation

## 2019-11-16 DIAGNOSIS — Z9981 Dependence on supplemental oxygen: Secondary | ICD-10-CM

## 2019-11-16 DIAGNOSIS — J9601 Acute respiratory failure with hypoxia: Secondary | ICD-10-CM

## 2019-11-16 DIAGNOSIS — Z8616 Personal history of COVID-19: Secondary | ICD-10-CM

## 2019-11-16 HISTORY — DX: Mixed hyperlipidemia: E78.2

## 2019-11-16 MED ORDER — NADOLOL 80 MG PO TABS: 160.0000 mg | ORAL_TABLET | Freq: Every day | ORAL | 0 refills | Status: DC

## 2019-11-16 NOTE — Assessment & Plan Note (Signed)
Patient is overdue to have her TSH checked.  Patient will return next week in order for [place] to do blood work.

## 2019-11-16 NOTE — Patient Instructions (Signed)
Migraine headache Uncontrolled.  Increase nadolol to 120 mg once daily.  Continue Imitrex.  Recommended use oxygen at onset of migraines.  It certainly may be that her intermittent hypoxia is causing the headaches.  Acute respiratory failure with hypoxia (HCC) Patient to wear oxygen at night at 2 L.  During the day when she is exerting herself I recommended she wear her oxygen as well.  My hope is she will not tire out so quickly and be able to gradually improve her lung function.  Hypothyroidism Patient is overdue to have her TSH checked.  Patient will return next week in order for [place] to do blood work.  Mixed hyperlipidemia In the past this has been uncontrolled.  We will recheck cholesterol panel next week.  Would strongly recommend trial on an alternative statin.  I do think waiting until she is feeling better from her Covid infection would be prudent.  COVID-19 virus infection Patient is still recovering.  I rescheduled her to return on the 26th to consider extending her Workmen's Comp.

## 2019-11-16 NOTE — Assessment & Plan Note (Signed)
Patient to wear oxygen at night at 2 L.  During the day when she is exerting herself I recommended she wear her oxygen as well.  My hope is she will not tire out so quickly and be able to gradually improve her lung function.

## 2019-11-16 NOTE — Assessment & Plan Note (Signed)
Patient is still recovering.  I rescheduled her to return on the 26th to consider extending her Workmen's Comp.

## 2019-11-16 NOTE — Progress Notes (Signed)
Subjective:  Patient ID: Taylor Ho, female    DOB: December 22, 1965  Age: 54 y.o. MRN: [id number]  Virtual Visit via Telephone Note   This visit type was conducted due to national recommendations for restrictions regarding the COVID-19 Pandemic (e.g. social distancing) in an effort to limit this patient's exposure and mitigate transmission in our community.  Due to @HIS @ co-morbid illnesses, this patient is at least at moderate risk for complications without adequate follow up.  This format is felt to be most appropriate for this patient at this time.  The patient did not have access to video technology/had technical difficulties with video requiring transitioning to audio format only (telephone).  All issues noted in this document were discussed and addressed.  No physical exam could be performed with this format.  Patient verbally consented to a telehealth visit.   Patient Location: Home Provider Location: Office  PCP:  Rochel Brome, MD   Chief Complaint  Patient presents with  . Headache  . Hyperlipidemia  . Hypothyroidism  . Bronchitis   HPI:  Patient is a 54 year old white female who presents via televisit for chronic follow-up.  The patient was admitted for COVID-19 bronchitis with significant hypoxia at the end of December.  She has been home for approximately 1 month.  She is not feeling back to normal.  She gets significant dyspnea on held exertion.  He also gets dyspnea on extended conversations, however I can tell she has improved since her last visit on January 21.  Her major complaints today are daily headaches.  She is taking her Imitrex however has had to increase the dose to 100 mg.  This does help some.  Her headaches are throbbing.  Occasionally nausea is associated.  No photophobia.  In addition she still has a dry cough occasionally.  She is eating and drinking well.  She is drinking water only.  She is concerned because she has had some memory issues where her family members  will say she told him something and she does not recall this occurring.  This happened more before she was admitted and immediately after she came home.  She is currently on Workmen's Comp. because she works for [employer] and it was believed by her work that she had contracted COVID-19 at the facility (per patient).  The patient does still have oxygen which she wears as needed and continuously at night.  Migraines: In addition to what was stated above the patient nadolol 80 mg once daily for migraines.  This has always worked in the past.  Patient has not tried using oxygen specifically when she has headaches.  The patient also has hyperlipidemia.  Her last LDL was in the 150s in June 2020.  The patient reports having taking Lipitor at some point in the past and not tolerating it and therefore was reluctant to take a statin.  Patient is unable to exercise significantly due to her shortness of breath. Patient also has hypothyroidism and her last TSH was slightly abnormal approximately 5.6 in June 2020.  Her current dose of Synthroid is 112 mcg daily.   Social Hx   Social History   Socioeconomic History  . Marital status: Married    Spouse name: Not on file  . Number of children: 4  . Years of education: Not on file  . Highest education level: Not on file  Occupational History  . Occupation: Nurse  Tobacco Use  . Smoking status: Never Smoker  . Smokeless tobacco: Never  Used  Substance and Sexual Activity  . Alcohol use: No  . Drug use: Never  . Sexual activity: Not on file  Other Topics Concern  . Not on file  Social History Narrative  . Not on file   Social Determinants of Health   Financial Resource Strain:   . Difficulty of Paying Living Expenses: Not on file  Food Insecurity:   . Worried About Programme researcher, broadcasting/film/video in the Last Year: Not on file  . Ran Out of Food in the Last Year: Not on file  Transportation Needs:   . Lack of Transportation (Medical): Not on file  .  Lack of Transportation (Non-Medical): Not on file  Physical Activity:   . Days of Exercise per Week: Not on file  . Minutes of Exercise per Session: Not on file  Stress:   . Feeling of Stress : Not on file  Social Connections:   . Frequency of Communication with Friends and Family: Not on file  . Frequency of Social Gatherings with Friends and Family: Not on file  . Attends Religious Services: Not on file  . Active Member of Clubs or Organizations: Not on file  . Attends Banker Meetings: Not on file  . Marital Status: Not on file   Past Medical History:  Diagnosis Date  . COVID-19   . Headache   . Hyperlipidemia   . Hypothyroidism   . Morbid (severe) obesity due to excess calories (HCC)    Family History  Problem Relation Age of Onset  . Alzheimer's disease Mother   . Alzheimer's disease Father   . Colon cancer Father   . Colon cancer Sister   . Stroke Brother   . Colitis Sister   . Irritable bowel syndrome Sister     Review of Systems  Constitutional: Positive for chills and diaphoresis. Negative for fatigue and fever.       Chills and sweats occur sometimes.  She attributes this to menopause.  HENT: Positive for ear pain. Negative for congestion and sore throat.        Occasional sharp pain in her ear.  Respiratory: Positive for cough and shortness of breath.   Cardiovascular: Negative for chest pain.  Gastrointestinal: Negative for abdominal pain, constipation, diarrhea, nausea and vomiting.  Endocrine: Negative for polyuria.  Genitourinary: Negative for dysuria and urgency.  Musculoskeletal: Positive for back pain. Negative for arthralgias and myalgias.       Patient has some back pain.  She does have meloxicam to take.  Neurological: Positive for headaches. Negative for dizziness.  Psychiatric/Behavioral: Negative for dysphoric mood. The patient is not nervous/anxious.        Family has told her she is irritable.     Objective:  BP 120/60    Pulse 98   Wt 218 lb (98.9 kg)   SpO2 91%   BMI 35.19 kg/m   BP/Weight 11/16/2019 10/07/2019 10/02/2019  Systolic BP 120 91 -  Diastolic BP 60 68 -  Wt. (Lbs) 218 - 218.7  BMI 35.19 - 35.3    Physical Exam Vitals reviewed.   Patient did become mildly dyspneic during our conversation.  Initially I could tell she was significantly better than her visit 3 weeks ago.    Lab Results  Component Value Date   WBC 13.8 (H) 10/07/2019   HGB 14.5 10/07/2019   HCT 43.5 10/07/2019   PLT 414 (H) 10/07/2019   GLUCOSE 124 (H) 10/07/2019   ALT 47 (H) 10/07/2019  AST 19 10/07/2019   NA 138 10/07/2019   K 4.6 10/07/2019   CL 105 10/07/2019   CREATININE 1.11 (H) 10/07/2019   BUN 36 (H) 10/07/2019   CO2 24 10/07/2019   HGBA1C 6.0 (H) 10/04/2019      Assessment & Plan:   Problem List Items Addressed This Visit    None      No problem-specific Assessment & Plan notes found for this encounter.   No orders of the defined types were placed in this encounter.   AN INDIVIDUALIZED CARE PLAN: was established or reinforced today.   The patient's disease status was assessed using clinical findings on exam, labs, and/or other diagnostic testing, such as xrays, to determine his/her success in meeting treatment goals based on disease specific evidence-based guidelines and found to be well controlled.   SELF MANAGEMENT: The patient and I together assessed ways to personally work towards obtaining the recommended goals  Support needs The patient and/or family needs were assessed and services were offered and not necessary at this time.    Follow-up: No follow-ups on file.  A CLINICAL SUMMARY including a written plan identify barriers to care unique to individual due to social or financial issues and help create solutions together. and a patient's and the patient's families understanding of their medical issues and care needs   Louanne Skye [facility] [phone]

## 2019-11-16 NOTE — Assessment & Plan Note (Signed)
In the past this has been uncontrolled.  We will recheck cholesterol panel next week.  Would strongly recommend trial on an alternative statin.  I do think waiting until she is feeling better from her Covid infection would be prudent.

## 2019-11-16 NOTE — Assessment & Plan Note (Signed)
Uncontrolled.  Increase nadolol to 120 mg once daily.  Continue Imitrex.  Recommended use oxygen at onset of migraines.  It certainly may be that her intermittent hypoxia is causing the headaches.

## 2019-11-20 ENCOUNTER — Ambulatory Visit: Payer: BC Managed Care – PPO | Admitting: Family Medicine

## 2019-11-24 ENCOUNTER — Other Ambulatory Visit: Payer: Self-pay

## 2019-11-24 ENCOUNTER — Ambulatory Visit (INDEPENDENT_AMBULATORY_CARE_PROVIDER_SITE_OTHER): Payer: BC Managed Care – PPO | Admitting: Family Medicine

## 2019-11-24 ENCOUNTER — Encounter: Payer: Self-pay | Admitting: Family Medicine

## 2019-11-24 VITALS — BP 130/84 | HR 72 | Temp 97.7°F | Resp 16 | Ht 64.0 in | Wt 223.2 lb

## 2019-11-24 DIAGNOSIS — R5383 Other fatigue: Secondary | ICD-10-CM | POA: Diagnosis not present

## 2019-11-24 DIAGNOSIS — G44209 Tension-type headache, unspecified, not intractable: Secondary | ICD-10-CM | POA: Diagnosis not present

## 2019-11-24 DIAGNOSIS — J9601 Acute respiratory failure with hypoxia: Secondary | ICD-10-CM

## 2019-11-24 DIAGNOSIS — R0789 Other chest pain: Secondary | ICD-10-CM

## 2019-11-24 DIAGNOSIS — U071 COVID-19: Secondary | ICD-10-CM

## 2019-11-24 DIAGNOSIS — E039 Hypothyroidism, unspecified: Secondary | ICD-10-CM

## 2019-11-24 HISTORY — DX: Other fatigue: R53.83

## 2019-11-24 NOTE — Progress Notes (Signed)
Subjective:  Patient ID: Taylor Ho, female    DOB: November 05, 1965  Age: 54 y.o. MRN: [id number]  Chief Complaint  Patient presents with  . Fatigue  . Shortness of Breath    HPI Patient is a 54 year old white female who presents for follow-up of Covid.  Patient was admitted for approximately 2 weeks around the beginning of 2021.  She has not fully recovered yet.  She continues to have periods of shortness of breath and severe fatigue with any sort of significant activity.  She reports having to stop approximately 10 times while she is trying to cook dinner.  She also complains of intermittent chills and sweats.  Denies coughing.  She does report some chest pressure with a sharp pains in her chest which are not necessarily associated with exertion.  She is also still having headaches.  These headaches have improved with my recommendation of wearing oxygen when she starts to have them come on.  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   Social History   Socioeconomic History  . Marital status: Married    Spouse name: Not on file  . Number of children: 4  . Years of education: Not on file  . Highest education level: Not on file  Occupational History  . Occupation: Nurse  Tobacco Use  . Smoking status: Never Smoker  . Smokeless tobacco: Never Used  Substance and Sexual Activity  . Alcohol use: No  . Drug use: Never  . Sexual activity: Not on file  Other Topics Concern  . Not on file  Social History Narrative  . Not on file   Social Determinants of Health   Financial Resource Strain:   . Difficulty of Paying Living Expenses: Not on file  Food Insecurity:   . Worried About Charity fundraiser in the Last Year: Not on file  . Ran Out of Food in the Last Year: Not on file  Transportation Needs:   . Lack of Transportation (Medical): Not on file  . Lack of Transportation (Non-Medical): Not on file   Physical Activity:   . Days of Exercise per Week: Not on file  . Minutes of Exercise per Session: Not on file  Stress:   . Feeling of Stress : Not on file  Social Connections:   . Frequency of Communication with Friends and Family: Not on file  . Frequency of Social Gatherings with Friends and Family: Not on file  . Attends Religious Services: Not on file  . Active Member of Clubs or Organizations: Not on file  . Attends Archivist Meetings: Not on file  . Marital Status: Not on file   Past Medical History:  Diagnosis Date  . COVID-19   . Headache   . Hyperlipidemia   . Hypothyroidism   . Morbid (severe) obesity due to excess calories (HCC)    Family History  Problem Relation Age of Onset  . Alzheimer's disease Mother   . Alzheimer's disease Father   . Colon cancer Father   . Colon cancer Sister   . Stroke Brother   . Colitis Sister   . Irritable bowel syndrome Sister     Review of Systems   Review of Systems  Constitutional: Negative for fever.  HENT: Negative for congestion, ear pain and sore throat.   Respiratory: Negative for cough.  Cardiovascular: See hpi. Gastrointestinal: Negative for abdominal pain,  constipation, diarrhea, nausea and vomiting.  Genitourinary: Negative for dysuria and urgency.  Musculoskeletal: Negative for myalgias. Negative for arthralgias. Neurological: Negative for dizziness. Psychiatric/Behavioral: Negative for depression. Negative for anhedonia. Negative for anxiety.     Objective:  BP 130/84   Pulse 72   Temp 97.7 F (36.5 C)   Resp 16   Ht 5\' 4"  (1.626 m)   Wt 223 lb 3.2 oz (101.2 kg)   BMI 38.31 kg/m   BP/Weight 11/24/2019 11/16/2019 10/07/2019  Systolic BP 130 120 91  Diastolic BP 84 60 68  Wt. (Lbs) 223.2 218 -  BMI 38.31 35.19 -    Physical Exam   Physical Exam Vitals reviewed.  Constitutional:      Appearance: tired appearance.  Cardiovascular:     Rate and Rhythm: Normal rate and regular rhythm.      Pulses: Normal pulses.     Heart sounds: Normal heart sounds.  Pulmonary:     Effort: Pulmonary effort is normal.     Breath sounds: Normal breath sounds. No wheezing, rhonchi or rales.  Dyspnea with speech has greatly improved since her virtual appointment 2 weeks ago. Abdominal:     Palpations: Abdomen is soft.     Tenderness: There is no abdominal tenderness.  Neurological:     Mental Status: ALERT Psychiatric:        Mood and Affect: Mood normal.        Behavior: Behavior normal.    Lab Results  Component Value Date   WBC 6.8 11/24/2019   HGB 14.2 11/24/2019   HCT 41.8 11/24/2019   PLT 352 11/24/2019   GLUCOSE 102 (H) 11/24/2019   ALT 47 (H) 10/07/2019   AST 15 11/24/2019   NA 143 11/24/2019   K 5.0 11/24/2019   CL 104 11/24/2019   CREATININE 1.40 (H) 11/24/2019   BUN 24 11/24/2019   CO2 24 10/07/2019   TSH 1.540 11/24/2019   HGBA1C 6.0 (H) 10/04/2019   EKG: NSR. No ST changes.   Assessment & Plan:  ,Acute hypoxemic respiratory failure due to COVID-19 [facility]) Improved, but still using at night. Also uses when becomes dyspneic and headache occurs.   Hypothyroidism Check tsh.  Other chest pain EKg is normal. Chest pain does not seem cardiac. Monitor.  Other fatigue Likely covid extended sxs. Check cbc, cmp. Tsh.   Tension-type headache, not intractable Due to low oxygen. Also gets migraines, but this headache feels different then her typical migraines. Follow-up: Return in about 2 weeks (around 12/08/2019).  AVS was given to patient prior to departure.  02/07/2020 [facility] [phone]

## 2019-11-25 LAB — COMP. METABOLIC PANEL (12)
AST: 15 IU/L (ref 0–40)
Albumin/Globulin Ratio: 1.7 (ref 1.2–2.2)
Albumin: 4.2 g/dL (ref 3.8–4.9)
Alkaline Phosphatase: 79 IU/L (ref 39–117)
BUN/Creatinine Ratio: 17 (ref 9–23)
BUN: 24 mg/dL (ref 6–24)
Bilirubin Total: 0.4 mg/dL (ref 0.0–1.2)
Calcium: 9.7 mg/dL (ref 8.7–10.2)
Chloride: 104 mmol/L (ref 96–106)
Creatinine, Ser: 1.4 mg/dL — ABNORMAL HIGH (ref 0.57–1.00)
GFR calc Af Amer: 49 mL/min/{1.73_m2} — ABNORMAL LOW (ref >59)
GFR calc non Af Amer: 43 mL/min/{1.73_m2} — ABNORMAL LOW (ref >59)
Globulin, Total: 2.5 g/dL (ref 1.5–4.5)
Glucose: 102 mg/dL — ABNORMAL HIGH (ref 65–99)
Potassium: 5 mmol/L (ref 3.5–5.2)
Sodium: 143 mmol/L (ref 134–144)
Total Protein: 6.7 g/dL (ref 6.0–8.5)

## 2019-11-25 LAB — CBC WITH DIFFERENTIAL/PLATELET
Basophils Absolute: 0.1 10*3/uL (ref 0.0–0.2)
Basos: 1 %
EOS (ABSOLUTE): 0.2 10*3/uL (ref 0.0–0.4)
Eos: 3 %
Hematocrit: 41.8 % (ref 34.0–46.6)
Hemoglobin: 14.2 g/dL (ref 11.1–15.9)
Immature Grans (Abs): 0 10*3/uL (ref 0.0–0.1)
Immature Granulocytes: 0 %
Lymphocytes Absolute: 1.9 10*3/uL (ref 0.7–3.1)
Lymphs: 28 %
MCH: 30 pg (ref 26.6–33.0)
MCHC: 34 g/dL (ref 31.5–35.7)
MCV: 88 fL (ref 79–97)
Monocytes Absolute: 0.7 10*3/uL (ref 0.1–0.9)
Monocytes: 11 %
Neutrophils Absolute: 3.8 10*3/uL (ref 1.4–7.0)
Neutrophils: 57 %
Platelets: 352 10*3/uL (ref 150–450)
RBC: 4.74 x10E6/uL (ref 3.77–5.28)
RDW: 14 % (ref 11.7–15.4)
WBC: 6.8 10*3/uL (ref 3.4–10.8)

## 2019-11-25 LAB — TSH: TSH: 1.54 u[IU]/mL (ref 0.450–4.500)

## 2019-11-26 ENCOUNTER — Encounter: Payer: Self-pay | Admitting: Family Medicine

## 2019-11-26 DIAGNOSIS — G44209 Tension-type headache, unspecified, not intractable: Secondary | ICD-10-CM

## 2019-11-26 DIAGNOSIS — R0789 Other chest pain: Secondary | ICD-10-CM

## 2019-11-26 HISTORY — DX: Tension-type headache, unspecified, not intractable: G44.209

## 2019-11-26 HISTORY — DX: Other chest pain: R07.89

## 2019-11-26 NOTE — Assessment & Plan Note (Signed)
Likely covid extended sxs. Check cbc, cmp. Tsh.

## 2019-11-26 NOTE — Assessment & Plan Note (Addendum)
EKg is normal. Chest pain does not seem cardiac. Monitor.

## 2019-11-26 NOTE — Patient Instructions (Signed)
Acute hypoxemic respiratory failure due to COVID-19 [facility]) Improved, but still using at night. Also uses when becomes dyspneic and headache occurs.   Hypothyroidism Check tsh.  Other chest pain EKg is normal. Chest pain does not seem cardiac. Monitor.  Other fatigue Likely covid extended sxs. Check cbc, cmp. Tsh.   Tension-type headache, not intractable Due to low oxygen. Also gets migraines, but this headache feels different then her typical migraines.

## 2019-11-26 NOTE — Assessment & Plan Note (Signed)
Check tsh 

## 2019-11-26 NOTE — Assessment & Plan Note (Signed)
Improved, but still using at night. Also uses when becomes dyspneic and headache occurs.

## 2019-11-26 NOTE — Assessment & Plan Note (Signed)
Due to low oxygen. Also gets migraines, but this headache feels different then her typical migraines.

## 2019-12-05 DIAGNOSIS — U071 COVID-19: Secondary | ICD-10-CM | POA: Diagnosis not present

## 2019-12-07 NOTE — Progress Notes (Signed)
Subjective:  Patient ID: Taylor Ho, female    DOB: October 24, 1965  Age: 54 y.o. MRN: [id number]  Chief Complaint  Patient presents with  . Headache  . Fatigue    HPI Patient is a 54 year old white female who presents for follow-up of Covid.  Patient was admitted for approximately 2 weeks around the beginning of 2021.  She has not fully recovered yet.  She continues to have periods of shortness of breath and severe fatigue with any sort of significant activity.  She reports having to stop approximately 10 times while she is trying to cook dinner.  She also complains of intermittent chills and sweats.  Denies coughing.  She does report some chest pressure with a sharp pains in her chest which are not necessarily associated with exertion.  She is also still having headaches.  These headaches have improved with my recommendation of wearing oxygen when she starts to have them come on.  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   Social History   Socioeconomic History  . Marital status: Married    Spouse name: Not on file  . Number of children: 4  . Years of education: Not on file  . Highest education level: Not on file  Occupational History  . Occupation: Nurse  Tobacco Use  . Smoking status: Never Smoker  . Smokeless tobacco: Never Used  Substance and Sexual Activity  . Alcohol use: No  . Drug use: Never  . Sexual activity: Not on file  Other Topics Concern  . Not on file  Social History Narrative  . Not on file   Social Determinants of Health   Financial Resource Strain:   . Difficulty of Paying Living Expenses:   Food Insecurity:   . Worried About Programme researcher, broadcasting/film/video in the Last Year:   . Barista in the Last Year:   Transportation Needs:   . Freight forwarder (Medical):   Marland Kitchen Lack of Transportation (Non-Medical):   Physical Activity:   . Days of Exercise per Week:   . Minutes  of Exercise per Session:   Stress:   . Feeling of Stress :   Social Connections:   . Frequency of Communication with Friends and Family:   . Frequency of Social Gatherings with Friends and Family:   . Attends Religious Services:   . Active Member of Clubs or Organizations:   . Attends Banker Meetings:   Marland Kitchen Marital Status:    Past Medical History:  Diagnosis Date  . COVID-19   . Headache   . Hyperlipidemia   . Hypothyroidism   . Morbid (severe) obesity due to excess calories (HCC)    Family History  Problem Relation Age of Onset  . Alzheimer's disease Mother   . Alzheimer's disease Father   . Colon cancer Father   . Colon cancer Sister   . Stroke Brother   . Colitis Sister   . Irritable bowel syndrome Sister     Review of Systems   Review of Systems  Constitutional: Negative for fever.  HENT: Negative for congestion, ear pain and sore throat.   Respiratory: Negative for cough.  Cardiovascular: See hpi. Gastrointestinal: Negative for abdominal pain, constipation, diarrhea, nausea and vomiting.  Genitourinary: Negative for dysuria and urgency.  Musculoskeletal: Negative for myalgias. Negative for arthralgias. Neurological: Negative for dizziness. Psychiatric/Behavioral: Negative for depression. Negative for  anhedonia. Negative for anxiety.     Objective:  BP 110/70   Pulse 78   Temp 98.2 F (36.8 C)   Resp 18   Ht 5\' 4"  (1.626 m)   Wt 223 lb 9.6 oz (101.4 kg)   BMI 38.38 kg/m   BP/Weight 12/08/2019 11/24/2019 04/05/6282  Systolic BP 662 947 654  Diastolic BP 70 84 60  Wt. (Lbs) 223.6 223.2 218  BMI 38.38 38.31 35.19    Physical Exam   Physical Exam Vitals reviewed.  Constitutional:      Appearance: tired appearance.  Cardiovascular:     Rate and Rhythm: Normal rate and regular rhythm.     Pulses: Normal pulses.     Heart sounds: Normal heart sounds.  Pulmonary:     Effort: Pulmonary effort is normal.     Breath sounds: Normal breath  sounds. No wheezing, rhonchi or rales.  Dyspnea with speech has greatly improved since her virtual appointment 2 weeks ago. Abdominal:     Palpations: Abdomen is soft.     Tenderness: There is no abdominal tenderness.  Neurological:     Mental Status: ALERT Psychiatric:        Mood and Affect: Mood normal.        Behavior: Behavior normal.    Lab Results  Component Value Date   WBC 6.8 11/24/2019   HGB 14.2 11/24/2019   HCT 41.8 11/24/2019   PLT 352 11/24/2019   GLUCOSE 102 (H) 12/08/2019   ALT 20 12/08/2019   AST 16 12/08/2019   NA 144 12/08/2019   K 4.9 12/08/2019   CL 106 12/08/2019   CREATININE 1.51 (H) 12/08/2019   BUN 27 (H) 12/08/2019   CO2 21 12/08/2019   TSH 1.540 11/24/2019   HGBA1C 6.0 (H) 10/04/2019   EKG: NSR. No ST changes.   Assessment & Plan:  Acute hypoxemic respiratory failure due to COVID-19 (HCC) Wear oxygen at night.   Migraine headache Start on aimovig 70 mg once monthly.    Sore throat Strep negative.  Treat empirically with amoxicillin as her throat looks horrible.  Adjustment insomnia Start on amitriptyline 25 mg 1 at night to help with insomnia as well as migraines.  Other fatigue Treat insomnia.  Some of this is post Covid.  Elevated serum creatinine Recheck CMP. ,Follow-up: Return in about 26 days (around 01/03/2020).  AVS was given to patient prior to departure.  Rochel Brome [facility] [phone]

## 2019-12-08 ENCOUNTER — Ambulatory Visit (INDEPENDENT_AMBULATORY_CARE_PROVIDER_SITE_OTHER): Payer: BC Managed Care – PPO | Admitting: Family Medicine

## 2019-12-08 ENCOUNTER — Other Ambulatory Visit: Payer: Self-pay

## 2019-12-08 VITALS — BP 110/70 | HR 78 | Temp 98.2°F | Resp 18 | Ht 64.0 in | Wt 223.6 lb

## 2019-12-08 DIAGNOSIS — J029 Acute pharyngitis, unspecified: Secondary | ICD-10-CM | POA: Diagnosis not present

## 2019-12-08 DIAGNOSIS — R7989 Other specified abnormal findings of blood chemistry: Secondary | ICD-10-CM | POA: Diagnosis not present

## 2019-12-08 DIAGNOSIS — R5383 Other fatigue: Secondary | ICD-10-CM

## 2019-12-08 DIAGNOSIS — F5102 Adjustment insomnia: Secondary | ICD-10-CM

## 2019-12-08 DIAGNOSIS — U071 COVID-19: Secondary | ICD-10-CM

## 2019-12-08 DIAGNOSIS — J9601 Acute respiratory failure with hypoxia: Secondary | ICD-10-CM

## 2019-12-08 DIAGNOSIS — G43019 Migraine without aura, intractable, without status migrainosus: Secondary | ICD-10-CM

## 2019-12-08 LAB — POCT RAPID STREP A (OFFICE): Rapid Strep A Screen: NEGATIVE

## 2019-12-08 MED ORDER — AIMOVIG 70 MG/ML ~~LOC~~ SOAJ: 1.0000 mL | SUBCUTANEOUS | 0 refills | Status: DC

## 2019-12-08 MED ORDER — AMITRIPTYLINE HCL 25 MG PO TABS: 25.0000 mg | ORAL_TABLET | Freq: Every day | ORAL | 0 refills | Status: DC

## 2019-12-08 MED ORDER — AMOXICILLIN 875 MG PO TABS: 875.0000 mg | ORAL_TABLET | Freq: Two times a day (BID) | ORAL | 0 refills | Status: DC

## 2019-12-08 NOTE — Patient Instructions (Signed)
Start amitriptyline 25 mg once at night for sleep and to prevent headaches.  Start Aimovig for migraines once a week Start amoxicillin 875 mg one twice a day for 10 days.   Insomnia Insomnia is a sleep disorder that makes it difficult to fall asleep or stay asleep. Insomnia can cause fatigue, low energy, difficulty concentrating, mood swings, and poor performance at work or school. There are three different ways to classify insomnia:  Difficulty falling asleep.  Difficulty staying asleep.  Waking up too early in the morning. Any type of insomnia can be long-term (chronic) or short-term (acute). Both are common. Short-term insomnia usually lasts for three months or less. Chronic insomnia occurs at least three times a week for longer than three months. What are the causes? Insomnia may be caused by another condition, situation, or substance, such as:  Anxiety.  Certain medicines.  Gastroesophageal reflux disease (GERD) or other gastrointestinal conditions.  Asthma or other breathing conditions.  Restless legs syndrome, sleep apnea, or other sleep disorders.  Chronic pain.  Menopause.  Stroke.  Abuse of alcohol, tobacco, or illegal drugs.  Mental health conditions, such as depression.  Caffeine.  Neurological disorders, such as Alzheimer's disease.  An overactive thyroid (hyperthyroidism). Sometimes, the cause of insomnia may not be known. What increases the risk? Risk factors for insomnia include:  Gender. Women are affected more often than men.  Age. Insomnia is more common as you get older.  Stress.  Lack of exercise.  Irregular work schedule or working night shifts.  Traveling between different time zones.  Certain medical and mental health conditions. What are the signs or symptoms? If you have insomnia, the main symptom is having trouble falling asleep or having trouble staying asleep. This may lead to other symptoms, such as:  Feeling fatigued or  having low energy.  Feeling nervous about going to sleep.  Not feeling rested in the morning.  Having trouble concentrating.  Feeling irritable, anxious, or depressed. How is this diagnosed? This condition may be diagnosed based on:  Your symptoms and medical history. Your health care provider may ask about: ? Your sleep habits. ? Any medical conditions you have. ? Your mental health.  A physical exam. How is this treated? Treatment for insomnia depends on the cause. Treatment may focus on treating an underlying condition that is causing insomnia. Treatment may also include:  Medicines to help you sleep.  Counseling or therapy.  Lifestyle adjustments to help you sleep better. Follow these instructions at home: Eating and drinking   Limit or avoid alcohol, caffeinated beverages, and cigarettes, especially close to bedtime. These can disrupt your sleep.  Do not eat a large meal or eat spicy foods right before bedtime. This can lead to digestive discomfort that can make it hard for you to sleep. Sleep habits   Keep a sleep diary to help you and your health care provider figure out what could be causing your insomnia. Write down: ? When you sleep. ? When you wake up during the night. ? How well you sleep. ? How rested you feel the next day. ? Any side effects of medicines you are taking. ? What you eat and drink.  Make your bedroom a dark, comfortable place where it is easy to fall asleep. ? Put up shades or blackout curtains to block light from outside. ? Use a white noise machine to block noise. ? Keep the temperature cool.  Limit screen use before bedtime. This includes: ? Watching TV. ? Using  your smartphone, tablet, or computer.  Stick to a routine that includes going to bed and waking up at the same times every day and night. This can help you fall asleep faster. Consider making a quiet activity, such as reading, part of your nighttime routine.  Try to avoid  taking naps during the day so that you sleep better at night.  Get out of bed if you are still awake after 15 minutes of trying to sleep. Keep the lights down, but try reading or doing a quiet activity. When you feel sleepy, go back to bed. General instructions  Take over-the-counter and prescription medicines only as told by your health care provider.  Exercise regularly, as told by your health care provider. Avoid exercise starting several hours before bedtime.  Use relaxation techniques to manage stress. Ask your health care provider to suggest some techniques that may work well for you. These may include: ? Breathing exercises. ? Routines to release muscle tension. ? Visualizing peaceful scenes.  Make sure that you drive carefully. Avoid driving if you feel very sleepy.  Keep all follow-up visits as told by your health care provider. This is important. Contact a health care provider if:  You are tired throughout the day.  You have trouble in your daily routine due to sleepiness.  You continue to have sleep problems, or your sleep problems get worse. Get help right away if:  You have serious thoughts about hurting yourself or someone else. If you ever feel like you may hurt yourself or others, or have thoughts about taking your own life, get help right away. You can go to your nearest emergency department or call:  Your local emergency services (911 in the [place]  A suicide crisis helpline, such as the [facility] at 859-786-5927. This is open 24 hours a day. Summary  Insomnia is a sleep disorder that makes it difficult to fall asleep or stay asleep.  Insomnia can be long-term (chronic) or short-term (acute).  Treatment for insomnia depends on the cause. Treatment may focus on treating an underlying condition that is causing insomnia.  Keep a sleep diary to help you and your health care provider figure out what could be causing your  insomnia. This information is not intended to replace advice given to you by your health care provider. Make sure you discuss any questions you have with your health care provider. Document Revised: 08/27/2017 Document Reviewed: 06/24/2017 Elsevier Patient Education  [place].  Migraine Headache A migraine headache is a very strong throbbing pain on one side or both sides of your head. This type of headache can also cause other symptoms. It can last from 4 hours to 3 days. Talk with your doctor about what things may bring on (trigger) this condition. What are the causes? The exact cause of this condition is not known. This condition may be triggered or caused by:  Drinking alcohol.  Smoking.  Taking medicines, such as: ? Medicine used to treat chest pain (nitroglycerin). ? Birth control pills. ? Estrogen. ? Some blood pressure medicines.  Eating or drinking certain products.  Doing physical activity. Other things that may trigger a migraine headache include:  Having a menstrual period.  Pregnancy.  Hunger.  Stress.  Not getting enough sleep or getting too much sleep.  Weather changes.  Tiredness (fatigue). What increases the risk?  Being 55-22 years old.  Being female.  Having a family history of migraine headaches.  Being Caucasian.  Having depression  or anxiety.  Being very overweight. What are the signs or symptoms?  A throbbing pain. This pain may: ? Happen in any area of the head, such as on one side or both sides. ? Make it hard to do daily activities. ? Get worse with physical activity. ? Get worse around bright lights or loud noises.  Other symptoms may include: ? Feeling sick to your stomach (nauseous). ? Vomiting. ? Dizziness. ? Being sensitive to bright lights, loud noises, or smells.  Before you get a migraine headache, you may get warning signs (an aura). An aura may include: ? Seeing flashing lights or having blind  spots. ? Seeing bright spots, halos, or zigzag lines. ? Having tunnel vision or blurred vision. ? Having numbness or a tingling feeling. ? Having trouble talking. ? Having weak muscles.  Some people have symptoms after a migraine headache (postdromal phase), such as: ? Tiredness. ? Trouble thinking (concentrating). How is this treated?  Taking medicines that: ? Relieve pain. ? Relieve the feeling of being sick to your stomach. ? Prevent migraine headaches.  Treatment may also include: ? Having acupuncture. ? Avoiding foods that bring on migraine headaches. ? Learning ways to control your body functions (biofeedback). ? Therapy to help you know and deal with negative thoughts (cognitive behavioral therapy). Follow these instructions at home: Medicines  Take over-the-counter and prescription medicines only as told by your doctor.  Ask your doctor if the medicine prescribed to you: ? Requires you to avoid driving or using heavy machinery. ? Can cause trouble pooping (constipation). You may need to take these steps to prevent or treat trouble pooping:  Drink enough fluid to keep your pee (urine) pale yellow.  Take over-the-counter or prescription medicines.  Eat foods that are high in fiber. These include beans, whole grains, and fresh fruits and vegetables.  Limit foods that are high in fat and sugar. These include fried or sweet foods. Lifestyle  Do not drink alcohol.  Do not use any products that contain nicotine or tobacco, such as cigarettes, e-cigarettes, and chewing tobacco. If you need help quitting, ask your doctor.  Get at least 8 hours of sleep every night.  Limit and deal with stress. General instructions      Keep a journal to find out what may bring on your migraine headaches. For example, write down: ? What you eat and drink. ? How much sleep you get. ? Any change in what you eat or drink. ? Any change in your medicines.  If you have a migraine  headache: ? Avoid things that make your symptoms worse, such as bright lights. ? It may help to lie down in a dark, quiet room. ? Do not drive or use heavy machinery. ? Ask your doctor what activities are safe for you.  Keep all follow-up visits as told by your doctor. This is important. Contact a doctor if:  You get a migraine headache that is different or worse than others you have had.  You have more than 15 headache days in one month. Get help right away if:  Your migraine headache gets very bad.  Your migraine headache lasts longer than 72 hours.  You have a fever.  You have a stiff neck.  You have trouble seeing.  Your muscles feel weak or like you cannot control them.  You start to lose your balance a lot.  You start to have trouble walking.  You pass out (faint).  You have a seizure. Summary  A migraine headache is a very strong throbbing pain on one side or both sides of your head. These headaches can also cause other symptoms.  This condition may be treated with medicines and changes to your lifestyle.  Keep a journal to find out what may bring on your migraine headaches.  Contact a doctor if you get a migraine headache that is different or worse than others you have had.  Contact your doctor if you have more than 15 headache days in a month. This information is not intended to replace advice given to you by your health care provider. Make sure you discuss any questions you have with your health care provider. Document Revised: 01/06/2019 Document Reviewed: 10/27/2018 Elsevier Patient Education  2020 [employer].

## 2019-12-09 LAB — COMPREHENSIVE METABOLIC PANEL
ALT: 20 IU/L (ref 0–32)
AST: 16 IU/L (ref 0–40)
Albumin/Globulin Ratio: 1.6 (ref 1.2–2.2)
Albumin: 4.2 g/dL (ref 3.8–4.9)
Alkaline Phosphatase: 74 IU/L (ref 39–117)
BUN/Creatinine Ratio: 18 (ref 9–23)
BUN: 27 mg/dL — ABNORMAL HIGH (ref 6–24)
Bilirubin Total: 0.3 mg/dL (ref 0.0–1.2)
CO2: 21 mmol/L (ref 20–29)
Calcium: 9.9 mg/dL (ref 8.7–10.2)
Chloride: 106 mmol/L (ref 96–106)
Creatinine, Ser: 1.51 mg/dL — ABNORMAL HIGH (ref 0.57–1.00)
GFR calc Af Amer: 45 mL/min/{1.73_m2} — ABNORMAL LOW (ref >59)
GFR calc non Af Amer: 39 mL/min/{1.73_m2} — ABNORMAL LOW (ref >59)
Globulin, Total: 2.7 g/dL (ref 1.5–4.5)
Glucose: 102 mg/dL — ABNORMAL HIGH (ref 65–99)
Potassium: 4.9 mmol/L (ref 3.5–5.2)
Sodium: 144 mmol/L (ref 134–144)
Total Protein: 6.9 g/dL (ref 6.0–8.5)

## 2019-12-11 ENCOUNTER — Encounter: Payer: Self-pay | Admitting: Family Medicine

## 2019-12-11 DIAGNOSIS — R7989 Other specified abnormal findings of blood chemistry: Secondary | ICD-10-CM

## 2019-12-11 DIAGNOSIS — F5102 Adjustment insomnia: Secondary | ICD-10-CM | POA: Insufficient documentation

## 2019-12-11 DIAGNOSIS — J029 Acute pharyngitis, unspecified: Secondary | ICD-10-CM | POA: Insufficient documentation

## 2019-12-11 HISTORY — DX: Adjustment insomnia: F51.02

## 2019-12-11 HISTORY — DX: Other specified abnormal findings of blood chemistry: R79.89

## 2019-12-11 HISTORY — DX: Acute pharyngitis, unspecified: J02.9

## 2019-12-11 NOTE — Assessment & Plan Note (Signed)
Start on amitriptyline 25 mg 1 at night to help with insomnia as well as migraines.

## 2019-12-11 NOTE — Assessment & Plan Note (Signed)
Wear oxygen at night.

## 2019-12-11 NOTE — Assessment & Plan Note (Signed)
Strep negative.  Treat empirically with amoxicillin as her throat looks horrible.

## 2019-12-11 NOTE — Assessment & Plan Note (Signed)
Start on aimovig 70 mg once monthly.

## 2019-12-11 NOTE — Assessment & Plan Note (Signed)
Recheck CMP 

## 2019-12-11 NOTE — Assessment & Plan Note (Signed)
Treat insomnia.  Some of this is post Covid.

## 2020-01-01 NOTE — Progress Notes (Signed)
Established Patient Office Visit  Subjective:  Patient ID: Taylor Ho, female    DOB: 06/16/1966  Age: 54 y.o. MRN: [id number]  CC:  Chief Complaint  Patient presents with  . Headache    Started on Amovig and Amitriptyline. Patient believes this regimen has helped.    HPI Taylor Ho presents for follow up of migraines. Patient is having migraines 4 times a week which is down from daily. I started her on aimovig and amitriptyline which have helped. Patient is taking imitrex approximately 4 times a week and since her last visit has not required 2 imitrex. Due for second aimovig shot next week.   Follow up fatigue has improved, but not resolved. Amitriptyline has helped with her sleep and her migraines. 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 She still has some DOE. Drinking plenty of water. She has not returned to work at [employer] and is concerned because she says she does three different jobs and it is too much.   Past Medical History:  Diagnosis Date  . COVID-19   . Headache   . Hyperlipidemia   . Hypothyroidism   . Morbid (severe) obesity due to excess calories [facility])     Past Surgical History:  Procedure Laterality Date  . CHOLECYSTECTOMY    . KNEE ARTHROSCOPY Left 08/13/2017   Procedure: ARTHROSCOPY KNEE;  Surgeon: Dorna Leitz, MD;  Location: [place];  Service: Orthopedics;  Laterality: Left;  . TUBAL LIGATION      Family History  Problem Relation Age of Onset  . Alzheimer's disease Mother   . Alzheimer's disease Father   . Colon cancer Father   . Colon cancer Sister   . Stroke Brother   . Colitis Sister   . Irritable bowel syndrome Sister     Social History   Socioeconomic History  . Marital status: Married    Spouse name: Not on file  . Number of children: 4  . Years of education: Not on file  . Highest education level: Not on file  Occupational History  . Occupation: Nurse  Tobacco Use  . Smoking status: Never Smoker  .  Smokeless tobacco: Never Used  Substance and Sexual Activity  . Alcohol use: No  . Drug use: Never  . Sexual activity: Not on file  Other Topics Concern  . Not on file  Social History Narrative  . Not on file   Social Determinants of Health   Financial Resource Strain:   . Difficulty of Paying Living Expenses:   Food Insecurity:   . Worried About Charity fundraiser in the Last Year:   . Arboriculturist in the Last Year:   Transportation Needs:   . Film/video editor (Medical):   Marland Kitchen Lack of Transportation (Non-Medical):   Physical Activity:   . Days of Exercise per Week:   . Minutes of Exercise per Session:   Stress:   . Feeling of Stress :   Social Connections:   . Frequency of Communication with Friends and Family:   . Frequency of Social Gatherings with Friends and Family:   . Attends Religious Services:   . Active Member of Clubs or Organizations:   . Attends Archivist Meetings:   Marland Kitchen Marital Status:   Intimate Partner Violence:   . Fear of Current or Ex-Partner:   . Emotionally Abused:   Marland Kitchen Physically Abused:   . Sexually Abused:     Outpatient Medications Prior to  Visit  Medication Sig Dispense Refill  . amitriptyline (ELAVIL) 25 MG tablet Take 1 tablet (25 mg total) by mouth at bedtime. 30 tablet 0  . amoxicillin (AMOXIL) 875 MG tablet Take 1 tablet (875 mg total) by mouth 2 (two) times daily. 20 tablet 0  . meloxicam (MOBIC) 7.5 MG tablet Take 7.5 mg by mouth daily.     . nadolol (CORGARD) 80 MG tablet Take 2 tablets (160 mg total) by mouth daily. (Patient taking differently: Take 120 mg by mouth daily. ) 60 tablet 0  . SUMAtriptan (IMITREX) 100 MG tablet Take 100 mg every 2 (two) hours as needed by mouth for migraine. May repeat in 2 hours if headache persists or recurs.    [name] (AIMOVIG) 70 MG/ML SOAJ Inject 1 mL into the skin every 30 (thirty) days. 3 pen 0  . levothyroxine (SYNTHROID) 112 MCG tablet Take 112 mcg by mouth daily.     No  facility-administered medications prior to visit.    No Known Allergies  ROS Review of Systems  Constitutional: Positive for fatigue. Negative for chills and fever.  HENT: Negative for congestion, ear pain, rhinorrhea and sore throat.   Respiratory: Positive for shortness of breath. Negative for cough.        Only with exertion.  Cardiovascular: Negative for chest pain.  Gastrointestinal: Negative for abdominal pain, constipation, diarrhea, nausea and vomiting.  Genitourinary: Negative for dysuria and urgency.  Musculoskeletal: Negative for arthralgias, back pain and myalgias.  Neurological: Positive for headaches. Negative for dizziness, weakness and light-headedness.  Psychiatric/Behavioral: Negative for dysphoric mood. The patient is not nervous/anxious.       Objective:    Physical Exam  Constitutional: She appears well-developed and well-nourished. No distress.  Cardiovascular: Normal rate, regular rhythm and normal heart sounds.  Pulmonary/Chest: Effort normal and breath sounds normal.  Neurological: She is alert.  Psychiatric: She has a normal mood and affect. Her behavior is normal.    BP 126/80 (BP Location: Right Arm, Patient Position: Sitting)   Pulse 71   Temp (!) 96.7 F (35.9 C) (Temporal)   Ht 5\' 6"  (1.676 m)   Wt 227 lb (103 kg)   SpO2 99%   BMI 36.64 kg/m  Wt Readings from Last 3 Encounters:  01/03/20 227 lb (103 kg)  12/08/19 223 lb 9.6 oz (101.4 kg)  11/24/19 223 lb 3.2 oz (101.2 kg)     Health Maintenance Due  Topic Date Due  . TETANUS/TDAP  Never done  . PAP SMEAR-Modifier  Never done    There are no preventive care reminders to display for this patient.  Lab Results  Component Value Date   TSH 1.540 11/24/2019   Lab Results  Component Value Date   WBC 6.8 11/24/2019   HGB 14.2 11/24/2019   HCT 41.8 11/24/2019   MCV 88 11/24/2019   PLT 352 11/24/2019   Lab Results  Component Value Date   NA 144 12/08/2019   K 4.9 12/08/2019    CO2 21 12/08/2019   GLUCOSE 102 (H) 12/08/2019   BUN 27 (H) 12/08/2019   CREATININE 1.51 (H) 12/08/2019   BILITOT 0.3 12/08/2019   ALKPHOS 74 12/08/2019   AST 16 12/08/2019   ALT 20 12/08/2019   PROT 6.9 12/08/2019   ALBUMIN 4.2 12/08/2019   CALCIUM 9.9 12/08/2019   ANIONGAP 9 10/07/2019   No results found for: CHOL No results found for: HDL No results found for: LDLCALC No results found for: TRIG No results  found for: [facility] Lab Results  Component Value Date   HGBA1C 6.0 (H) 10/04/2019      Assessment & Plan:  1. Intractable migraine without aura and without status migrainosus Increase Aimovig to 140 mg/mL once monthly.  Continue amitriptyline 25 mg 1 at night.  Continue Imitrex, although patient is overusing.  Continue nadolol which she has been on for years.  Upon further review of her records she has been on Ajovy and Emgality previously, however, I am unsure how she responded to these medications as I saw this after she had departed the office.  2. Other fatigue Improved but not resolved.  Amitriptyline is helping her sleep better.  3. Acute hypoxemic respiratory failure due to COVID-19 [facility]) Resolved.  No further hypoxia.  She does still have some dyspnea on exertion.  4.  Acquired hypothyroidism-continue current medications.  Refill sent.   Meds ordered this encounter  Medications  . levothyroxine (SYNTHROID) 112 MCG tablet    Sig: Take 1 tablet (112 mcg total) by mouth daily.    Dispense:  90 tablet    Refill:  0  . Erenumab-aooe (AIMOVIG) 140 MG/ML SOAJ    Sig: Inject 140 mg into the skin every 30 (thirty) days.    Dispense:  140 mg    Refill:  2    Follow-up: Return in about 4 weeks (around 01/31/2020).  Letter written for return to work on January 08, 2020 for 4 hours/day for a max of 5 days/week.  This will be for 2 weeks and then the patient will return to 8-hour days for max of 5 days/week.  I have requested the patient let me know if she is being required  to work more hours as I do not feel like she will recover fully if she is.  Blane Ohara, MD

## 2020-01-03 ENCOUNTER — Other Ambulatory Visit: Payer: Self-pay

## 2020-01-03 ENCOUNTER — Ambulatory Visit (INDEPENDENT_AMBULATORY_CARE_PROVIDER_SITE_OTHER): Payer: BC Managed Care – PPO | Admitting: Family Medicine

## 2020-01-03 ENCOUNTER — Encounter: Payer: Self-pay | Admitting: Family Medicine

## 2020-01-03 VITALS — BP 126/80 | HR 71 | Temp 96.7°F | Ht 66.0 in | Wt 227.0 lb

## 2020-01-03 DIAGNOSIS — J9601 Acute respiratory failure with hypoxia: Secondary | ICD-10-CM

## 2020-01-03 DIAGNOSIS — R5383 Other fatigue: Secondary | ICD-10-CM | POA: Diagnosis not present

## 2020-01-03 DIAGNOSIS — U071 COVID-19: Secondary | ICD-10-CM | POA: Diagnosis not present

## 2020-01-03 DIAGNOSIS — E038 Other specified hypothyroidism: Secondary | ICD-10-CM

## 2020-01-03 DIAGNOSIS — G43019 Migraine without aura, intractable, without status migrainosus: Secondary | ICD-10-CM | POA: Diagnosis not present

## 2020-01-03 MED ORDER — AIMOVIG 140 MG/ML ~~LOC~~ SOAJ: 140.0000 mg | SUBCUTANEOUS | 2 refills | Status: DC

## 2020-01-03 MED ORDER — LEVOTHYROXINE SODIUM 112 MCG PO TABS: 112.0000 ug | ORAL_TABLET | Freq: Every day | ORAL | 0 refills | Status: DC

## 2020-01-03 NOTE — Patient Instructions (Signed)
Increase aimovig to 140 mg/ml once monthly.  Continue amitriptyline 25 mg once daily at night.  Continue imitrex as directed on rx.  Recommend sleep 8 hours/night.  Recommend return to work 1/2 days x 5 days per week x 2 weeks, then return to 5 eight hour days per week.

## 2020-01-04 ENCOUNTER — Other Ambulatory Visit: Payer: Self-pay

## 2020-01-04 MED ORDER — MELOXICAM 7.5 MG PO TABS: 7.5000 mg | ORAL_TABLET | Freq: Every day | ORAL | 0 refills | Status: DC

## 2020-01-05 DIAGNOSIS — U071 COVID-19: Secondary | ICD-10-CM | POA: Diagnosis not present

## 2020-01-09 ENCOUNTER — Other Ambulatory Visit: Payer: Self-pay | Admitting: Family Medicine

## 2020-01-12 ENCOUNTER — Telehealth: Payer: Self-pay

## 2020-01-12 ENCOUNTER — Other Ambulatory Visit: Payer: Self-pay | Admitting: Family Medicine

## 2020-01-12 DIAGNOSIS — G43019 Migraine without aura, intractable, without status migrainosus: Secondary | ICD-10-CM

## 2020-01-12 NOTE — Telephone Encounter (Signed)
[facility] called with concerns that they have not been able to contact patient after multiple attempts. Attempted to contact patient however had to leave a message. Will contact [facility], spoke with Ozzie Hoyle who stated they have a office go out to check on patient. She will call back to update [place].

## 2020-01-30 NOTE — Progress Notes (Signed)
Subjective:  Patient ID: Taylor Ho, female    DOB: 10/27/1965  Age: 54 y.o. MRN: [id number]  Chief Complaint  Patient presents with  . Return to work    4 week f/u to see about patient returning to work full time possibly    HPI Patient presents for follow up from covid 19. Initial diagnosis Dec 27th and hospitalized at [facility] in [place] from 10/01/2019- 10/07/2019.Patient has seen me regularly since discharge.  At her last appointment,  I had written for her to return 4 hours per day 5 days per week. Per the patient her work said if she is still using oxygen, she is a liability and could not come back to work. Per the patient she is severely short of breath with any exertion. She has to wear oxygen 2 L after exertion. She has chosen to get a Clinical research associate rather than to go back to work.  She worked previously for [employer] as a Engineer, civil (consulting).  Her COVID-19 was considered Workmen's Comp. Patient says she is absolutely exhausted, shortness of breath with minimal exertion, and simply unable to go back to work.   Past Medical History:  Diagnosis Date  . COVID-19   . Headache   . Hyperlipidemia   . Hypothyroidism   . Morbid (severe) obesity due to excess calories [facility])    Past Surgical History:  Procedure Laterality Date  . CHOLECYSTECTOMY    . KNEE ARTHROSCOPY Left 08/13/2017   Procedure: ARTHROSCOPY KNEE;  Surgeon: Jodi Geralds, MD;  Location: [facility];  Service: Orthopedics;  Laterality: Left;  . TUBAL LIGATION      Family History  Problem Relation Age of Onset  . Alzheimer's disease Mother   . Alzheimer's disease Father   . Colon cancer Father   . Colon cancer Sister   . Stroke Brother   . Colitis Sister   . Irritable bowel syndrome Sister    Social History   Socioeconomic History  . Marital status: Married    Spouse name: Not on file  . Number of children: 4  . Years of education: Not on file  . Highest education level: Not on file  Occupational History  . Occupation:  Nurse  Tobacco Use  . Smoking status: Never Smoker  . Smokeless tobacco: Never Used  Substance and Sexual Activity  . Alcohol use: No  . Drug use: Never  . Sexual activity: Not on file  Other Topics Concern  . Not on file  Social History Narrative  . Not on file   Social Determinants of Health   Financial Resource Strain:   . Difficulty of Paying Living Expenses:   Food Insecurity:   . Worried About Programme researcher, broadcasting/film/video in the Last Year:   . Barista in the Last Year:   Transportation Needs:   . Freight forwarder (Medical):   Marland Kitchen Lack of Transportation (Non-Medical):   Physical Activity:   . Days of Exercise per Week:   . Minutes of Exercise per Session:   Stress:   . Feeling of Stress :   Social Connections:   . Frequency of Communication with Friends and Family:   . Frequency of Social Gatherings with Friends and Family:   . Attends Religious Services:   . Active Member of Clubs or Organizations:   . Attends Banker Meetings:   Marland Kitchen Marital Status:     Review of Systems  Constitutional: Positive for fatigue. Negative for chills and fever.  HENT: Negative for congestion, ear pain, rhinorrhea and sore throat.   Respiratory: Positive for shortness of breath. Negative for cough.   Cardiovascular: Positive for palpitations (heart racing. Her heart rate is usually over 100 when she checks it. This is usually after she has been exerting herself. ). Negative for chest pain.  Gastrointestinal: Negative for abdominal pain, constipation, diarrhea, nausea and vomiting.  Genitourinary: Negative for difficulty urinating, dysuria and urgency.  Musculoskeletal: Negative for back pain and myalgias.  Neurological: Positive for light-headedness and headaches. Negative for dizziness.  Psychiatric/Behavioral: Positive for dysphoric mood (Irritated because she cannot do what she wants to do. ). The patient is not nervous/anxious.      Objective:  BP 110/86   Pulse  89   Temp 97.8 F (36.6 C)   Ht 5\' 6"  (1.676 m)   Wt 231 lb (104.8 kg)   SpO2 98%   BMI 37.28 kg/m   BP/Weight 01/31/2020 01/03/2020 05/26/9370  Systolic BP 696 789 381  Diastolic BP 86 80 70  Wt. (Lbs) 231 227 223.6  BMI 37.28 36.64 38.38    Physical Exam Vitals reviewed.  Constitutional:      Appearance: Normal appearance. She is obese.  Cardiovascular:     Rate and Rhythm: Normal rate and regular rhythm.     Heart sounds: Normal heart sounds.  Pulmonary:     Effort: Pulmonary effort is normal. No respiratory distress.     Breath sounds: Normal breath sounds.  Abdominal:     General: Abdomen is flat. Bowel sounds are normal.     Palpations: Abdomen is soft.     Tenderness: There is no abdominal tenderness.  Neurological:     Mental Status: She is alert and oriented to person, place, and time.  Psychiatric:        Mood and Affect: Mood normal.        Behavior: Behavior normal.   6 minute walk - oxygen saturation 95%. It did not drop.   Lab Results  Component Value Date   WBC 6.8 11/24/2019   HGB 14.2 11/24/2019   HCT 41.8 11/24/2019   PLT 352 11/24/2019   GLUCOSE 102 (H) 12/08/2019   ALT 20 12/08/2019   AST 16 12/08/2019   NA 144 12/08/2019   K 4.9 12/08/2019   CL 106 12/08/2019   CREATININE 1.51 (H) 12/08/2019   BUN 27 (H) 12/08/2019   CO2 21 12/08/2019   TSH 1.540 11/24/2019   HGBA1C 6.0 (H) 10/04/2019      Assessment & Plan:  1. Dyspnea due to COVID-19: pulse oximetry was maintained at 95% with 6 minute walk. 2. Extreme exhaustion  Refer to for post covid 19 symptoms Post-Covid [place].  [id number] to schedule.   Follow-up: Return in about 4 weeks (around 02/28/2020).  An After Visit Summary was printed and given to the patient.  Rochel Brome [facility] [phone]

## 2020-01-31 ENCOUNTER — Ambulatory Visit (INDEPENDENT_AMBULATORY_CARE_PROVIDER_SITE_OTHER): Payer: BC Managed Care – PPO | Admitting: Family Medicine

## 2020-01-31 ENCOUNTER — Other Ambulatory Visit: Payer: Self-pay

## 2020-01-31 ENCOUNTER — Encounter: Payer: Self-pay | Admitting: Family Medicine

## 2020-01-31 VITALS — BP 110/86 | HR 89 | Temp 97.8°F | Ht 66.0 in | Wt 231.0 lb

## 2020-01-31 DIAGNOSIS — R5383 Other fatigue: Secondary | ICD-10-CM | POA: Diagnosis not present

## 2020-01-31 DIAGNOSIS — R0600 Dyspnea, unspecified: Secondary | ICD-10-CM | POA: Diagnosis not present

## 2020-01-31 DIAGNOSIS — U071 COVID-19: Secondary | ICD-10-CM

## 2020-02-01 ENCOUNTER — Ambulatory Visit (INDEPENDENT_AMBULATORY_CARE_PROVIDER_SITE_OTHER): Payer: BC Managed Care – PPO | Admitting: Nurse Practitioner

## 2020-02-01 ENCOUNTER — Other Ambulatory Visit: Payer: Self-pay | Admitting: Nurse Practitioner

## 2020-02-01 ENCOUNTER — Ambulatory Visit
Admission: RE | Admit: 2020-02-01 | Discharge: 2020-02-01 | Disposition: A | Discharge status: Home / Self Care | Payer: BC Managed Care – PPO | Source: Ambulatory Visit | Attending: Nurse Practitioner | Admitting: Nurse Practitioner

## 2020-02-01 VITALS — BP 122/74 | HR 73 | Temp 97.3°F | Ht 66.0 in | Wt 232.5 lb

## 2020-02-01 DIAGNOSIS — R5381 Other malaise: Secondary | ICD-10-CM

## 2020-02-01 DIAGNOSIS — Z8616 Personal history of COVID-19: Secondary | ICD-10-CM | POA: Diagnosis not present

## 2020-02-01 DIAGNOSIS — J9601 Acute respiratory failure with hypoxia: Secondary | ICD-10-CM

## 2020-02-01 DIAGNOSIS — R0609 Other forms of dyspnea: Secondary | ICD-10-CM

## 2020-02-01 DIAGNOSIS — R0600 Dyspnea, unspecified: Secondary | ICD-10-CM | POA: Diagnosis not present

## 2020-02-01 DIAGNOSIS — R5383 Other fatigue: Secondary | ICD-10-CM

## 2020-02-01 DIAGNOSIS — R0602 Shortness of breath: Secondary | ICD-10-CM | POA: Diagnosis not present

## 2020-02-01 DIAGNOSIS — R002 Palpitations: Secondary | ICD-10-CM

## 2020-02-01 HISTORY — DX: Other malaise: R53.81

## 2020-02-01 HISTORY — DX: Personal history of COVID-19: Z86.16

## 2020-02-01 HISTORY — DX: Other fatigue: R53.83

## 2020-02-01 HISTORY — DX: Palpitations: R00.2

## 2020-02-01 HISTORY — DX: Acute respiratory failure with hypoxia: J96.01

## 2020-02-01 HISTORY — DX: Dyspnea, unspecified: R06.00

## 2020-02-01 HISTORY — DX: Other forms of dyspnea: R06.09

## 2020-02-01 MED ORDER — ALBUTEROL SULFATE HFA 108 (90 BASE) MCG/ACT IN AERS: 2.0000 | INHALATION_SPRAY | Freq: Four times a day (QID) | RESPIRATORY_TRACT | 1 refills | Status: AC | PRN

## 2020-02-01 MED ORDER — PREDNISONE 10 MG PO TABS: ORAL_TABLET | ORAL | 0 refills | Status: DC

## 2020-02-01 NOTE — Assessment & Plan Note (Signed)
History of Covid Pneumonia Acute Respiratory failure:  Assessment:  Lung sounds are a little diminished. Patient walked in office today sats remained above 93% for entire walk - please keep close watch on O2 sats at home if - may use 2 L [place] if needed to keep O2 sats above 90%. Shortness of breath could be due to physical deconditioning. No follow up imaging has been done since hospital discharge - concerned for possible developing fibrosis - will check chest x ray.  Plan:  Will check chest x ray and call with results  Will order albuterol as needed for shortness of breath  Will place referral to pulmonary - will need PFT and sleep study - concerned for sleep apnea   Fatigue:  Will order PT  May start gentle flow yoga  Will order labs and call with results  Hand out given on nutritional rehabilitation   Heart palpitations:  EKG: showed Sinus rhythm with 1 st degree AV block with PVC's  Will refer to cardiology for heart palpitations/ feeling of heart racing, shortness of breath, fatigue, history of covid - may need ZIO patch monitor    Follow up in 2 months or sooner if needed

## 2020-02-01 NOTE — Progress Notes (Signed)
Patient notified of results, verbally understood. No additional questions.

## 2020-02-01 NOTE — Progress Notes (Signed)
@Patient  ID: , female    DOB: 10-29-1965, 54 y.o.   MRN: [id number]  Chief Complaint  Patient presents with  . Shortness of Breath    Patient referred by her PCP she had an appt with yesterday. Patient stated she has felt better the past 2 days but before that had been getting SOB she uses 2L of O2 as needed.    Referring provider: [id number], MD   54 year old female never smoker with history of migraines, hypothyroidism, obesity, hyperlipidemia. Diagnosed with covid December 2020.   Recent Significant Encounters:   Timeline of illness:  09/23/20 Covid Test: positive  10/02/19 - 10/07/19 Hospital admission: Admitted to the hospital for Covid pneumonia and acute respiratory failure. Patient was treated with remdesivir, Decadron, IV steroids. She was discharged home on oxygen at 2 L..   11/16/19 PCP follow-up: Patient complained of headache/migraine. Nadolol was increased to 120 mg daily continued on Imitrex and continued on O2 at 2 L.  11/24/2019 PCP follow-up: Patient was continued on O2 at 2 L nasal cannula  12/08/2019 PCP follow-up: Patient complaining of migraine and started on Aimovig 70 mg monthly. Patient also complained of insomnia started on amitriptyline 25 mg at night.  01/03/2020 PCP follow-up: Follow-up for headache and insomnia -noted the amitriptyline was helping.  01/31/2020 PCP follow-up: Patient continued of ongoing severe fatigue and shortness of breath -referred to post Covid clinic.   Imaging:  10/05/19 chest x ray: Persistent patchy opacities in both lungs with mixed interval changes, favoring pneumonia, although a component of pulmonary edema is not excluded given the borderline cardiomegaly.    HPI  Patient presents today for post Covid clinic visit. She was seen by her PCP yesterday who referred her here. Patient states that since she was discharged home from the hospital she has continued to have shortness of breath with minimal exertion and severe  fatigue. Patient has been using 2 L of oxygen as needed since hospital discharge. She has not been able to return to work due to shortness of breath and fatigue. She states that she is having intermittent episodes of her heart racing and states that her heart rate is around 150 bpm during these episodes. Patient admits that she has been inactive since diagnosed with Covid. She does not really do much around the house. She never had follow-up imaging done after discharge from the hospital. She states that she feels very fatigued in the mornings when she wakes up and has headaches throughout the day. She does have a history of migraines and is being treated by her PCP for this. Denies f/c/s, n/v/d, hemoptysis, PND, leg swelling, chest pain or edema.     Note:  Patient walked in office today sats remained above 93% for entire walk  EKG: showed Sinus rhythm with 1 st degree AV block with PVC's     No Known Allergies  Immunization History  Administered Date(s) Administered  . Influenza-Unspecified 07/07/2019    Past Medical History:  Diagnosis Date  . COVID-19   . Headache   . Hyperlipidemia   . Hypothyroidism   . Morbid (severe) obesity due to excess calories (HCC)     Tobacco History: Social History   Tobacco Use  Smoking Status Never Smoker  Smokeless Tobacco Never Used   Counseling given: Not Answered   Outpatient Encounter Medications as of 02/01/2020  Medication Sig  . amitriptyline (ELAVIL) 25 MG tablet TAKE 1 TABLET BY MOUTH AT BEDTIME  . 04/02/2020 (  AIMOVIG) 140 MG/ML SOAJ Inject 140 mg into the skin every 30 (thirty) days.  Marland Kitchen levothyroxine (SYNTHROID) 112 MCG tablet Take 1 tablet (112 mcg total) by mouth daily.  . meloxicam (MOBIC) 7.5 MG tablet Take 1 tablet (7.5 mg total) by mouth daily.  . nadolol (CORGARD) 80 MG tablet Take 2 tablets by mouth once daily  . SUMAtriptan (IMITREX) 100 MG tablet Take 100 mg every 2 (two) hours as needed by mouth for migraine. May  repeat in 2 hours if headache persists or recurs.  Marland Kitchen albuterol (VENTOLIN HFA) 108 (90 Base) MCG/ACT inhaler Inhale 2 puffs into the lungs every 6 (six) hours as needed for wheezing or shortness of breath.   No facility-administered encounter medications on file as of 02/01/2020.     Review of Systems  Review of Systems  Constitutional: Positive for activity change (decreased). Negative for chills and fever.  HENT: Negative.   Respiratory: Positive for shortness of breath. Negative for cough.   Cardiovascular: Positive for palpitations. Negative for chest pain and leg swelling.  Gastrointestinal: Negative.   Allergic/Immunologic: Negative.   Neurological: Positive for headaches.  Psychiatric/Behavioral: Negative.        Physical Exam  BP 122/74 (BP Location: Left Arm, Patient Position: Sitting, Cuff Size: Large)   Pulse 73   Temp (!) 97.3 F (36.3 C)   Ht 5\' 6"  (1.676 m)   Wt 232 lb 8 oz (105.5 kg)   SpO2 98%   BMI 37.53 kg/m   Wt Readings from Last 5 Encounters:  02/01/20 232 lb 8 oz (105.5 kg)  01/31/20 231 lb (104.8 kg)  01/03/20 227 lb (103 kg)  12/08/19 223 lb 9.6 oz (101.4 kg)  11/24/19 223 lb 3.2 oz (101.2 kg)     Physical Exam Vitals and nursing note reviewed.  Constitutional:      General: She is not in acute distress.    Appearance: She is well-developed.  Cardiovascular:     Rate and Rhythm: Normal rate and regular rhythm.  Pulmonary:     Effort: Pulmonary effort is normal.     Breath sounds: Normal breath sounds.  Neurological:     Mental Status: She is alert and oriented to person, place, and time.        Assessment & Plan:   History of COVID-19 History of Covid Pneumonia Acute Respiratory failure:  Assessment:  Lung sounds are a little diminished. Patient walked in office today sats remained above 93% for entire walk - please keep close watch on O2 sats at home if - may use 2 L [place] if needed to keep O2 sats above 90%. Shortness of breath  could be due to physical deconditioning. No follow up imaging has been done since hospital discharge - concerned for possible developing fibrosis - will check chest x ray.  Plan:  Will check chest x ray and call with results  Will order albuterol as needed for shortness of breath  Will place referral to pulmonary - will need PFT and sleep study - concerned for sleep apnea   Fatigue:  Will order PT  May start gentle flow yoga  Will order labs and call with results  Hand out given on nutritional rehabilitation   Heart palpitations:  EKG: showed Sinus rhythm with 1 st degree AV block with PVC's  Will refer to cardiology for heart palpitations/ feeling of heart racing, shortness of breath, fatigue, history of covid - may need ZIO patch monitor    Follow up in 2  months or sooner if needed        Fenton Foy, NP 02/01/2020

## 2020-02-01 NOTE — Patient Instructions (Addendum)
History of Covid Pneumonia Acute Respiratory failure:  Patient walked in office today sats remained above 93% for entire walk - please keep close watch on O2 sats at home if - may use 2 L [place] if needed to keep O2 sats above 90%.   Will check chest x ray and call with results  Will order albuterol as needed for shortness of breath  Will place referral to pulmonary - will need PFT and sleep study - concerned for sleep apnea   Fatigue:  Will order PT  May start gentle flow yoga  Will order labs and call with results  Hand out given on nutritional rehabilitation   Heart palpitations:  EKG: showed Sinus rhythm with 1 st degree AV block with PVC's  Will refer to cardiology for heart palpitations/ feeling of heart racing, shortness of breath, fatigue, history of covid - may need ZIO patch monitor    Follow up in 2 months or sooner if needed

## 2020-02-02 ENCOUNTER — Telehealth: Payer: Self-pay | Admitting: Nurse Practitioner

## 2020-02-02 LAB — CBC WITH DIFFERENTIAL/PLATELET
Basophils Absolute: 0.1 10*3/uL (ref 0.0–0.2)
Basos: 1 %
EOS (ABSOLUTE): 0.2 10*3/uL (ref 0.0–0.4)
Eos: 3 %
Hematocrit: 44.7 % (ref 34.0–46.6)
Hemoglobin: 15.1 g/dL (ref 11.1–15.9)
Immature Grans (Abs): 0 10*3/uL (ref 0.0–0.1)
Immature Granulocytes: 0 %
Lymphocytes Absolute: 2 10*3/uL (ref 0.7–3.1)
Lymphs: 26 %
MCH: 29.4 pg (ref 26.6–33.0)
MCHC: 33.8 g/dL (ref 31.5–35.7)
MCV: 87 fL (ref 79–97)
Monocytes Absolute: 0.6 10*3/uL (ref 0.1–0.9)
Monocytes: 8 %
Neutrophils Absolute: 4.5 10*3/uL (ref 1.4–7.0)
Neutrophils: 62 %
Platelets: 346 10*3/uL (ref 150–450)
RBC: 5.13 x10E6/uL (ref 3.77–5.28)
RDW: 12.5 % (ref 11.7–15.4)
WBC: 7.4 10*3/uL (ref 3.4–10.8)

## 2020-02-02 LAB — COMPREHENSIVE METABOLIC PANEL
ALT: 19 IU/L (ref 0–32)
AST: 25 IU/L (ref 0–40)
Albumin/Globulin Ratio: 1.4 (ref 1.2–2.2)
Albumin: 4.5 g/dL (ref 3.8–4.9)
Alkaline Phosphatase: 122 IU/L — ABNORMAL HIGH (ref 39–117)
BUN/Creatinine Ratio: 17 (ref 9–23)
BUN: 25 mg/dL — ABNORMAL HIGH (ref 6–24)
Bilirubin Total: 0.3 mg/dL (ref 0.0–1.2)
CO2: 20 mmol/L (ref 20–29)
Calcium: 10.1 mg/dL (ref 8.7–10.2)
Chloride: 105 mmol/L (ref 96–106)
Creatinine, Ser: 1.45 mg/dL — ABNORMAL HIGH (ref 0.57–1.00)
GFR calc Af Amer: 47 mL/min/{1.73_m2} — ABNORMAL LOW (ref >59)
GFR calc non Af Amer: 41 mL/min/{1.73_m2} — ABNORMAL LOW (ref >59)
Globulin, Total: 3.3 g/dL (ref 1.5–4.5)
Glucose: 103 mg/dL — ABNORMAL HIGH (ref 65–99)
Potassium: 5.5 mmol/L — ABNORMAL HIGH (ref 3.5–5.2)
Sodium: 146 mmol/L — ABNORMAL HIGH (ref 134–144)
Total Protein: 7.8 g/dL (ref 6.0–8.5)

## 2020-02-02 LAB — VITAMIN B12: Vitamin B-12: 521 pg/mL (ref 232–1245)

## 2020-02-02 LAB — VITAMIN D 25 HYDROXY (VIT D DEFICIENCY, FRACTURES): Vit D, 25-Hydroxy: 14.7 ng/mL — ABNORMAL LOW (ref 30.0–100.0)

## 2020-02-02 NOTE — Telephone Encounter (Signed)
Patient called office requesting a work note keeping her out of work for the next 2 months. Patient stated she has been out of work since she had COVID. Her PCP has written her out then referred her to our clinic. Please advise.

## 2020-02-04 DIAGNOSIS — U071 COVID-19: Secondary | ICD-10-CM | POA: Diagnosis not present

## 2020-02-06 ENCOUNTER — Encounter: Payer: Self-pay | Admitting: Nurse Practitioner

## 2020-02-06 NOTE — Telephone Encounter (Signed)
Letter has been created.

## 2020-02-06 NOTE — Telephone Encounter (Signed)
Yes. Patient can be out of work for the requested time frame. Thanks. Please make work note.

## 2020-02-10 ENCOUNTER — Other Ambulatory Visit: Payer: Self-pay | Admitting: Physician Assistant

## 2020-02-23 ENCOUNTER — Encounter: Payer: Self-pay | Admitting: Cardiology

## 2020-02-23 ENCOUNTER — Ambulatory Visit (INDEPENDENT_AMBULATORY_CARE_PROVIDER_SITE_OTHER): Payer: BC Managed Care – PPO | Admitting: Cardiology

## 2020-02-23 ENCOUNTER — Other Ambulatory Visit: Payer: Self-pay

## 2020-02-23 ENCOUNTER — Telehealth: Payer: Self-pay | Admitting: Radiology

## 2020-02-23 VITALS — BP 110/80 | HR 78 | Ht 66.0 in | Wt 232.0 lb

## 2020-02-23 DIAGNOSIS — R0602 Shortness of breath: Secondary | ICD-10-CM

## 2020-02-23 DIAGNOSIS — E7800 Pure hypercholesterolemia, unspecified: Secondary | ICD-10-CM | POA: Diagnosis not present

## 2020-02-23 DIAGNOSIS — R002 Palpitations: Secondary | ICD-10-CM | POA: Diagnosis not present

## 2020-02-23 DIAGNOSIS — R0789 Other chest pain: Secondary | ICD-10-CM | POA: Diagnosis not present

## 2020-02-23 LAB — BASIC METABOLIC PANEL
BUN/Creatinine Ratio: 15 (ref 9–23)
BUN: 22 mg/dL (ref 6–24)
CO2: 23 mmol/L (ref 20–29)
Calcium: 9.5 mg/dL (ref 8.7–10.2)
Chloride: 102 mmol/L (ref 96–106)
Creatinine, Ser: 1.44 mg/dL — ABNORMAL HIGH (ref 0.57–1.00)
GFR calc Af Amer: 48 mL/min/{1.73_m2} — ABNORMAL LOW (ref >59)
GFR calc non Af Amer: 41 mL/min/{1.73_m2} — ABNORMAL LOW (ref >59)
Glucose: 96 mg/dL (ref 65–99)
Potassium: 5.3 mmol/L — ABNORMAL HIGH (ref 3.5–5.2)
Sodium: 139 mmol/L (ref 134–144)

## 2020-02-23 MED ORDER — METOPROLOL TARTRATE 100 MG PO TABS: ORAL_TABLET | ORAL | 0 refills | Status: DC

## 2020-02-23 NOTE — Patient Instructions (Addendum)
Medication Instructions:  *If you need a refill on your cardiac medications before your next appointment, please call your pharmacy*  Lab Work: Your physician recommends that you have lab work today- BMET  If you have labs (blood work) drawn today and your tests are completely normal, you will receive your results only by: Marland Kitchen MyChart Message (if you have MyChart) OR . A paper copy in the mail If you have any lab test that is abnormal or we need to change your treatment, we will call you to review the results.  Testing/Procedures: Your physician has requested that you have an echocardiogram. Echocardiography is a painless test that uses sound waves to create images of your heart. It provides your doctor with information about the size and shape of your heart and how well your heart's chambers and valves are working. This procedure takes approximately one hour. There are no restrictions for this procedure.  Your physician has requested that you have cardiac CT. Cardiac computed tomography (CT) is a painless test that uses an x-ray machine to take clear, detailed pictures of your heart. For further information please visit HugeFiesta.tn. Please follow instruction sheet as given.  ZIO XT- Long Term Monitor Instructions   Your physician has requested you wear your ZIO patch monitor___14____days.   This is a single patch monitor.  Irhythm supplies one patch monitor per enrollment.  Additional stickers are not available.   Please do not apply patch if you will be having a Nuclear Stress Test, Echocardiogram, Cardiac CT, MRI, or Chest Xray during the time frame you would be wearing the monitor. The patch cannot be worn during these tests.  You cannot remove and re-apply the ZIO XT patch monitor.   Your ZIO patch monitor will be sent USPS Priority mail from [facility] directly to your home address. The monitor may also be mailed to a PO BOX if home delivery is not available.   It may  take 3-5 days to receive your monitor after you have been enrolled.   Once you have received you monitor, please review enclosed instructions.  Your monitor has already been registered assigning a specific monitor serial # to you.   Applying the monitor   Shave hair from upper left chest.   Hold abrader disc by orange tab.  Rub abrader in 40 strokes over left upper chest as indicated in your monitor instructions.   Clean area with 4 enclosed alcohol pads .  Use all pads to assure are is cleaned thoroughly.  Let dry.   Apply patch as indicated in monitor instructions.  Patch will be place under collarbone on left side of chest with arrow pointing upward.   Rub patch adhesive wings for 2 minutes.Remove white label marked "1".  Remove white label marked "2".  Rub patch adhesive wings for 2 additional minutes.   While looking in a mirror, press and release button in center of patch.  A small green light will flash 3-4 times .  This will be your only indicator the monitor has been turned on.     Do not shower for the first 24 hours.  You may shower after the first 24 hours.   Press button if you feel a symptom. You will hear a small click.  Record Date, Time and Symptom in the Patient Log Book.   When you are ready to remove patch, follow instructions on last 2 pages of Patient Log Book.  Stick patch monitor onto last page of Patient Log  Book.   Place Patient Log Book in [place] box.  Use locking tab on box and tape box closed securely.  The [employer] and [employer] has [employer] on it.  Please place in mailbox as soon as possible.  Your physician should have your test results approximately 7 days after the monitor has been mailed back to [facility].   Call [place] at [phone] if you have questions regarding your ZIO XT patch monitor.  Call them immediately if you see an orange light blinking on your monitor.   If your monitor falls off in less than 4 days contact  our Monitor department at [phone].  If your monitor becomes loose or falls off after 4 days call [employer] at [phone] for suggestions on securing your monitor.    Follow-Up: At [facility], you and your health needs are our priority.  As part of our continuing mission to provide you with exceptional heart care, we have created designated Provider Care Teams.  These Care Teams include your primary Cardiologist (physician) and Advanced Practice Providers (APPs -  Physician Assistants and Nurse Practitioners) who all work together to provide you with the care you need, when you need it.  We recommend signing up for the patient portal called "MyChart".  Sign up information is provided on this After Visit Summary.  MyChart is used to connect with patients for Virtual Visits (Telemedicine).  Patients are able to view lab/test results, encounter notes, upcoming appointments, etc.  Non-urgent messages can be sent to your provider as well.   To learn more about what you can do with MyChart, go to [email or web address].    Your next appointment:   As needed  The format for your next appointment:   In Person  Provider:   You may see Candee Furbish, MD or one of the following Advanced Practice Providers on your designated Care Team:    Truitt Merle, NP  Cecilie Kicks, NP  Kathyrn Drown, NP   Your cardiac CT will be scheduled at one of the below locations:   [facility] [address] [place], [place] [address] [phone]  If scheduled at [facility], please arrive at the [facility] main entrance of [facility] 30 minutes prior to test start time. Proceed to the [facility] Radiology Department (first floor) to check-in and test prep.  Please follow these instructions carefully (unless otherwise directed):   On the Night Before the Test: . Be sure to Drink plenty of water. . Do not consume any caffeinated/decaffeinated beverages or chocolate 12  hours prior to your test. . Do not take any antihistamines 12 hours prior to your test. . Please hold your Mobic the day before  On the Day of the Test: . Drink plenty of water. Do not drink any water within one hour of the test. . Do not eat any food 4 hours prior to the test. . You may take your regular medications prior to the test.  . Take metoprolol (Lopressor) 100 mg two hours prior to test. . FEMALES- please wear underwire-free bra if available      After the Test: . Drink plenty of water. . After receiving IV contrast, you may experience a mild flushed feeling. This is normal. . On occasion, you may experience a mild rash up to 24 hours after the test. This is not dangerous. If this occurs, you can take Benadryl 25 mg and increase your fluid intake. . If you experience  trouble breathing, this can be serious. If it is severe call 911 IMMEDIATELY. If it is mild, please call our office.  Once we have confirmed authorization from your insurance company, we will call you to set up a date and time for your test.   For non-scheduling related questions, please contact the cardiac imaging nurse navigator should you have any questions/concerns: Marchia Bond, Cardiac Imaging Nurse Navigator [place] Saver, Interim Cardiac Imaging Nurse [place] and Vascular Services Direct Office Dial: [phone]   For scheduling needs, including cancellations and rescheduling, please call [phone].

## 2020-02-23 NOTE — Progress Notes (Signed)
Cardiology Office Note:    Date:  02/23/2020   ID:  Taylor Ho, DOB 07/20/1966, MRN [id number]  PCP:  Rochel Brome, MD  Cardiologist:  Candee Furbish, MD  Electrophysiologist:  None   Referring MD: Fenton Foy, NP     History of Present Illness:    Taylor Ho is a 54 y.o. female here for evaluation of shortness of breath and fatigue post Covid infection on December 27, hospitalized from 1/3 to 10/07/2019 at [facility] at the request of Dr. Tobie Poet.  She has been wearing oxygen 2 L after exertion.  She is severely short of breath with any exertion.  Per prior office note she is chosen to get a lawyer rather than going back to work.  She worked at [employer] as a Marine scientist.  Workmen's Comp. considered.  Her heart rate is usually over 100 when she self checks.  This is usually after exerting.  Has been more irritable because of lack of exercise capacity.  One issue after the other. Heart racing at times. Rare chest pressure, moderate.   No tob. No prior CAD history No early FHX of CAD. Mother and Father Alzheimer's  Left knee issue with edema post meniscal surgery  In the office setting previously a 6-minute walk resulted in oxygen saturation 95% on room air, did not drop.  She was referred to the post Covid care center.  Husband had CABG in 10/20. He had COVID and only cough. Daughter a Marine scientist as well.   Obviously she is very frustrated with her symptoms.  Still feeling occasional racing heartbeat, palpitations.  Past Medical History:  Diagnosis Date  . Acute respiratory failure with hypoxia ([place]) 02/01/2020  . Adjustment insomnia 12/11/2019  . AKI (acute kidney injury) ([place]) 10/02/2019  . COVID-19   . Diarrhea due to COVID-19 10/02/2019  . Dyspnea on exertion 02/01/2020  . Elevated serum creatinine 12/11/2019  . Extreme exhaustion 11/24/2019  . Fatigue 02/01/2020  . Headache   . Heart palpitations 02/01/2020  . History of COVID-19 02/01/2020  . Hyperlipidemia   . Hypothyroidism     . Migraine headache 10/02/2019  . Mixed hyperlipidemia 11/16/2019  . Morbid (severe) obesity due to excess calories ([place])   . Obesity (BMI 30-39.9) 10/02/2019  . Other chest pain 11/26/2019  . Physical deconditioning 02/01/2020  . 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 Sore throat 12/11/2019  . Tension-type headache, not intractable 11/26/2019    Past Surgical History:  Procedure Laterality Date  . CHOLECYSTECTOMY    . KNEE ARTHROSCOPY Left 08/13/2017   Procedure: ARTHROSCOPY KNEE;  Surgeon: Dorna Leitz, MD;  Location: [facility];  Service: Orthopedics;  Laterality: Left;  . TUBAL LIGATION      Current Medications: Current Meds  Medication Sig  . albuterol (VENTOLIN HFA) 108 (90 Base) MCG/ACT inhaler Inhale 2 puffs into the lungs every 6 (six) hours as needed for wheezing or shortness of breath.  Marland Kitchen amitriptyline (ELAVIL) 25 MG tablet TAKE 1 TABLET BY MOUTH AT BEDTIME  . Erenumab-aooe (AIMOVIG) 140 MG/ML SOAJ Inject 140 mg into the skin every 30 (thirty) days.  Marland Kitchen levothyroxine (SYNTHROID) 112 MCG tablet Take 1 tablet (112 mcg total) by mouth daily.  . meloxicam (MOBIC) 7.5 MG tablet Take 1 tablet (7.5 mg total) by mouth daily.  . nadolol (CORGARD) 80 MG tablet Take 2 tablets by mouth once daily  . SUMAtriptan (IMITREX) 100 MG tablet Take 100 mg every 2 (two) hours as needed by mouth for migraine. May  repeat in 2 hours if headache persists or recurs.     Allergies:   Patient has no known allergies.   Social History   Socioeconomic History  . Marital status: Married    Spouse name: Not on file  . Number of children: 4  . Years of education: Not on file  . Highest education level: Not on file  Occupational History  . Occupation: Nurse  Tobacco Use  . Smoking status: Never Smoker  . Smokeless tobacco: Never Used  Substance and Sexual Activity  . Alcohol use: No  . Drug use: Never  . Sexual activity: Not on file  Other Topics Concern  . Not on file  Social History Narrative  . Not on  file   Social Determinants of Health   Financial Resource Strain:   . Difficulty of Paying Living Expenses:   Food Insecurity:   . Worried About Charity fundraiser in the Last Year:   . Arboriculturist in the Last Year:   Transportation Needs:   . Film/video editor (Medical):   Marland Kitchen Lack of Transportation (Non-Medical):   Physical Activity:   . Days of Exercise per Week:   . Minutes of Exercise per Session:   Stress:   . Feeling of Stress :   Social Connections:   . Frequency of Communication with Friends and Family:   . Frequency of Social Gatherings with Friends and Family:   . Attends Religious Services:   . Active Member of Clubs or Organizations:   . Attends Archivist Meetings:   Marland Kitchen Marital Status:      Family History: The patient's family history includes Alzheimer's disease in her father and mother; Colitis in her sister; Colon cancer in her father and sister; Irritable bowel syndrome in her sister; Stroke in her brother.  ROS:   Please see the history of present illness.   Fatigue, shortness of breath, racing heartbeat, chest pressure, denies syncope bleeding orthopnea PND  All other systems reviewed and are negative.  EKGs/Labs/Other Studies Reviewed:    The following studies were reviewed today: Ultrasound of lower extremities 10/03/2019-no DVTs  EKG:  EKG is  ordered today.  The ekg ordered today demonstrates sinus rhythm 69 baseline artifact on ECG 11/24/2019.  Recent Labs: 11/24/2019: TSH 1.540 02/01/2020: ALT 19; BUN 25; Creatinine, Ser 1.45; Hemoglobin 15.1; Platelets 346; Potassium 5.5; Sodium 146  Recent Lipid Panel No results found for: CHOL, TRIG, HDL, CHOLHDL, VLDL, LDLCALC, LDLDIRECT  Physical Exam:    VS:  BP 110/80   Pulse 78   Ht _0  (1.676 m)   Wt 232 lb (105.2 kg)   SpO2 97%   BMI 37.45 kg/m     Wt Readings from Last 3 Encounters:  02/23/20 232 lb (105.2 kg)  02/01/20 232 lb 8 oz (105.5 kg)  01/31/20 231 lb (104.8 kg)      GEN: Overweight Well nourished, well developed in no acute distress HEENT: Normal NECK: No JVD; No carotid bruits LYMPHATICS: No lymphadenopathy CARDIAC: RRR, no murmurs, rubs, gallops RESPIRATORY:  Clear to auscultation without rales, wheezing or rhonchi  ABDOMEN: Soft, non-tender, non-distended MUSCULOSKELETAL: No left lower extremity edema; No deformity  SKIN: Warm and dry NEUROLOGIC:  Alert and oriented x 3 PSYCHIATRIC:  Normal affect   ASSESSMENT:    1. Palpitations   2. Shortness of breath   3. Other chest pain   4. Pure hypercholesterolemia    PLAN:    In order of  problems listed above:  Dyspnea on exertion post Covid infection -Reassuring 6-minute walk with no decrease in oxygen saturation at prior office visit with Dr. Tobie Poet team. -We will go ahead and check an echocardiogram to ensure proper structure and function of her heart. -Has upcoming appointment with pulmonary.  Palpitations/racing heartbeat -We will go ahead and check a Zio patch monitor to ensure there were no dangerous or adverse arrhythmias present. -Okay to continue with nadolol, beta-blocker.  Chest pressure -Could be anginal equivalent.  We will go ahead and check a coronary CT scan with possible FFR analysis.  Her husband recently had bypass surgery.  Minor left lower extremity edema -From prior knee surgery.  Hyperlipidemia -LDL 158 previously.  Hemoglobin 14.5 hemoglobin A1c 6.0 creatinine 1.45 mildly elevated.   Elevated creatinine 1.45 -Encourage hydration prior to CT scan.  Try to avoid NSAID Mobic prior. We will check another basic metabolic profile prior to CT scan.   Medication Adjustments/Labs and Tests Ordered: Current medicines are reviewed at length with the patient today.  Concerns regarding medicines are outlined above.  Orders Placed This Encounter  Procedures  . CT CORONARY MORPH W/CTA COR W/SCORE W/CA W/CM &/OR WO/CM  . CT CORONARY FRACTIONAL FLOW RESERVE DATA PREP  . CT  CORONARY FRACTIONAL FLOW RESERVE FLUID ANALYSIS  . Basic metabolic panel  . LONG TERM MONITOR (3-14 DAYS)  . ECHOCARDIOGRAM COMPLETE   Meds ordered this encounter  Medications  . metoprolol tartrate (LOPRESSOR) 100 MG tablet    Sig: Take one tablet two hours prior to CT procedure.    Dispense:  1 tablet    Refill:  0    Patient Instructions  Medication Instructions:  *If you need a refill on your cardiac medications before your next appointment, please call your pharmacy*  Lab Work: Your physician recommends that you have lab work today- BMET  If you have labs (blood work) drawn today and your tests are completely normal, you will receive your results only by: Marland Kitchen MyChart Message (if you have MyChart) OR . A paper copy in the mail If you have any lab test that is abnormal or we need to change your treatment, we will call you to review the results.  Testing/Procedures: Your physician has requested that you have an echocardiogram. Echocardiography is a painless test that uses sound waves to create images of your heart. It provides your doctor with information about the size and shape of your heart and how well your heart's chambers and valves are working. This procedure takes approximately one hour. There are no restrictions for this procedure.  Your physician has requested that you have cardiac CT. Cardiac computed tomography (CT) is a painless test that uses an x-ray machine to take clear, detailed pictures of your heart. For further information please visit HugeFiesta.tn. Please follow instruction sheet as given.  ZIO XT- Long Term Monitor Instructions   Your physician has requested you wear your ZIO patch monitor___14____days.   This is a single patch monitor.  [employer] supplies one patch monitor per enrollment.  Additional stickers are not available.   Please do not apply patch if you will be having a Nuclear Stress Test, Echocardiogram, Cardiac CT, MRI, or Chest Xray during  the time frame you would be wearing the monitor. The patch cannot be worn during these tests.  You cannot remove and re-apply the ZIO XT patch monitor.   Your ZIO patch monitor will be sent USPS Priority mail from [facility] directly to your home  address. The monitor may also be mailed to a PO BOX if home delivery is not available.   It may take 3-5 days to receive your monitor after you have been enrolled.   Once you have received you monitor, please review enclosed instructions.  Your monitor has already been registered assigning a specific monitor serial # to you.   Applying the monitor   Shave hair from upper left chest.   Hold abrader disc by orange tab.  Rub abrader in 40 strokes over left upper chest as indicated in your monitor instructions.   Clean area with 4 enclosed alcohol pads .  Use all pads to assure are is cleaned thoroughly.  Let dry.   Apply patch as indicated in monitor instructions.  Patch will be place under collarbone on left side of chest with arrow pointing upward.   Rub patch adhesive wings for 2 minutes.Remove white label marked "1".  Remove white label marked "2".  Rub patch adhesive wings for 2 additional minutes.   While looking in a mirror, press and release button in center of patch.  A small green light will flash 3-4 times .  This will be your only indicator the monitor has been turned on.     Do not shower for the first 24 hours.  You may shower after the first 24 hours.   Press button if you feel a symptom. You will hear a small click.  Record Date, Time and Symptom in the Patient Log Book.   When you are ready to remove patch, follow instructions on last 2 pages of Patient Log Book.  Stick patch monitor onto last page of Patient Log Book.   Place Patient Log Book in [place] box.  Use locking tab on box and tape box closed securely.  The [employer] has [employer] on it.  Please place in mailbox as soon as possible.  Your physician  should have your test results approximately 7 days after the monitor has been mailed back to [facility].   Call [place] at [phone] if you have questions regarding your ZIO XT patch monitor.  Call them immediately if you see an orange light blinking on your monitor.   If your monitor falls off in less than 4 days contact our Monitor department at [phone].  If your monitor becomes loose or falls off after 4 days call [employer] at [phone] for suggestions on securing your monitor.    Follow-Up: At [facility], you and your health needs are our priority.  As part of our continuing mission to provide you with exceptional heart care, we have created designated Provider Care Teams.  These Care Teams include your primary Cardiologist (physician) and Advanced Practice Providers (APPs -  Physician Assistants and Nurse Practitioners) who all work together to provide you with the care you need, when you need it.  We recommend signing up for the patient portal called "MyChart".  Sign up information is provided on this After Visit Summary.  MyChart is used to connect with patients for Virtual Visits (Telemedicine).  Patients are able to view lab/test results, encounter notes, upcoming appointments, etc.  Non-urgent messages can be sent to your provider as well.   To learn more about what you can do with MyChart, go to [email or web address].    Your next appointment:   As needed  The format for your next appointment:   In Person  Provider:   You may see Candee Furbish, MD or  one of the following Advanced Practice Providers on your designated Care Team:    Truitt Merle, NP  Cecilie Kicks, NP  Kathyrn Drown, NP   Your cardiac CT will be scheduled at one of the below locations:   [facility] [address] [place], [place] [address] [phone]  If scheduled at [facility], please arrive at the [facility] main entrance of [facility] 30 minutes prior to test start time. Proceed to the [facility] Radiology Department (first floor) to check-in and test prep.  Please follow these instructions carefully (unless otherwise directed):   On the Night Before the Test: . Be sure to Drink plenty of water. . Do not consume any caffeinated/decaffeinated beverages or chocolate 12 hours prior to your test. . Do not take any antihistamines 12 hours prior to your test. . Please hold your Mobic the day before  On the Day of the Test: . Drink plenty of water. Do not drink any water within one hour of the test. . Do not eat any food 4 hours prior to the test. . You may take your regular medications prior to the test.  . Take metoprolol (Lopressor) 100 mg two hours prior to test. . FEMALES- please wear underwire-free bra if available      After the Test: . Drink plenty of water. . After receiving IV contrast, you may experience a mild flushed feeling. This is normal. . On occasion, you may experience a mild rash up to 24 hours after the test. This is not dangerous. If this occurs, you can take Benadryl 25 mg and increase your fluid intake. . If you experience trouble breathing, this can be serious. If it is severe call 911 IMMEDIATELY. If it is mild, please call our office.  Once we have confirmed authorization from your insurance company, we will call you to set up a date and time for your test.   For non-scheduling related questions, please contact the cardiac imaging nurse navigator should you have any questions/concerns: Marchia Bond, Cardiac Imaging Nurse Navigator [place] Saver, Interim Cardiac Imaging Nurse [place] and Vascular Services Direct Office Dial: [phone]   For scheduling needs, including cancellations and rescheduling, please call [phone].        Signed, Candee Furbish, MD  02/23/2020 10:06 AM    [facility]

## 2020-02-23 NOTE — Telephone Encounter (Signed)
Enrolled patient for a 14 day Zio monitor to be mailed to patients home.  

## 2020-02-29 ENCOUNTER — Ambulatory Visit (INDEPENDENT_AMBULATORY_CARE_PROVIDER_SITE_OTHER): Payer: BC Managed Care – PPO

## 2020-02-29 DIAGNOSIS — R002 Palpitations: Secondary | ICD-10-CM | POA: Diagnosis not present

## 2020-02-29 DIAGNOSIS — R0602 Shortness of breath: Secondary | ICD-10-CM | POA: Diagnosis not present

## 2020-02-29 DIAGNOSIS — R0789 Other chest pain: Secondary | ICD-10-CM | POA: Diagnosis not present

## 2020-02-29 DIAGNOSIS — E7800 Pure hypercholesterolemia, unspecified: Secondary | ICD-10-CM

## 2020-03-04 ENCOUNTER — Encounter: Payer: Self-pay | Admitting: Family Medicine

## 2020-03-04 ENCOUNTER — Ambulatory Visit (INDEPENDENT_AMBULATORY_CARE_PROVIDER_SITE_OTHER): Payer: BC Managed Care – PPO | Admitting: Family Medicine

## 2020-03-04 ENCOUNTER — Other Ambulatory Visit: Payer: Self-pay

## 2020-03-04 VITALS — BP 108/76 | HR 84 | Temp 97.2°F | Ht 66.0 in | Wt 232.2 lb

## 2020-03-04 DIAGNOSIS — R002 Palpitations: Secondary | ICD-10-CM

## 2020-03-04 DIAGNOSIS — R0789 Other chest pain: Secondary | ICD-10-CM

## 2020-03-04 DIAGNOSIS — R5383 Other fatigue: Secondary | ICD-10-CM | POA: Diagnosis not present

## 2020-03-04 DIAGNOSIS — G43019 Migraine without aura, intractable, without status migrainosus: Secondary | ICD-10-CM

## 2020-03-04 DIAGNOSIS — G43719 Chronic migraine without aura, intractable, without status migrainosus: Secondary | ICD-10-CM

## 2020-03-04 DIAGNOSIS — F5102 Adjustment insomnia: Secondary | ICD-10-CM

## 2020-03-04 DIAGNOSIS — Z8616 Personal history of COVID-19: Secondary | ICD-10-CM

## 2020-03-04 DIAGNOSIS — R5381 Other malaise: Secondary | ICD-10-CM

## 2020-03-04 DIAGNOSIS — E038 Other specified hypothyroidism: Secondary | ICD-10-CM

## 2020-03-04 DIAGNOSIS — R0609 Other forms of dyspnea: Secondary | ICD-10-CM

## 2020-03-04 DIAGNOSIS — R0600 Dyspnea, unspecified: Secondary | ICD-10-CM

## 2020-03-04 DIAGNOSIS — E782 Mixed hyperlipidemia: Secondary | ICD-10-CM | POA: Diagnosis not present

## 2020-03-04 DIAGNOSIS — E669 Obesity, unspecified: Secondary | ICD-10-CM

## 2020-03-04 MED ORDER — VITAMIN D (ERGOCALCIFEROL) 1.25 MG (50000 UNIT) PO CAPS: 50000.0000 [IU] | ORAL_CAPSULE | ORAL | 0 refills | Status: DC

## 2020-03-04 MED ORDER — NURTEC 75 MG PO TBDP: ORAL_TABLET | ORAL | 1 refills | Status: DC

## 2020-03-04 MED ORDER — AMITRIPTYLINE HCL 25 MG PO TABS: 25.0000 mg | ORAL_TABLET | Freq: Every day | ORAL | 3 refills | Status: DC

## 2020-03-04 MED ORDER — AIMOVIG 140 MG/ML ~~LOC~~ SOAJ: 140.0000 mg | SUBCUTANEOUS | 2 refills | Status: DC

## 2020-03-04 NOTE — Progress Notes (Signed)
Subjective:  Patient ID: Taylor Ho, female    DOB: 05-20-1966  Age: 54 y.o. MRN: [id number]  Chief Complaint  Patient presents with  . Shortness of Breath    HPI Patient presents for follow up from covid 19. Patient is a Press photographer. Initial diagnosis Dec 27th and hospitalized at [facility] in [place] from 10/01/2019- 10/07/2019. Patient has seen me regularly since discharge. When I tried to have her return to work part time, but the patient did not feel she could do this. She has significant DOE. I referred her to the Covid 19 long hauler clinic. She has improved, but still wares out very quickly. She has chosen to get a Clinical research associate rather than to go back to work as she is a Designer, multimedia. Intermittent episodes of her heart racing and states that her heart rate is around 150 bpm during these episodes.  The NP, Angus Seller, in the pulmonary clinic for long haulers: Ordered a cxr (results: Borderline cardiomegaly with improved bibasilar aeration. Residual mild atelectasis or developing scar after COVID 19 pneumonia at both lung bases) PFT  Sleep study Zios patch monitor. Ordered Pulmonary referral and Cardiology referral    Physical therapy   Gentle flow yoga  Ordered labs   Education on nutrition given.   She has seen Dr. Anne Fu, cardiology. He ordered:  Coronary CT scan with possible FFR analysis  Echocardiogram  EKG showed sinus rhythm with 1st degree bock with pvcs.   Patient has history of hyperlipidemia. Currently on no medicines. She tries to eat healthy, but is too tired to exercise.   Patient has been suffering from daily headaches and migraines. These improved with use of aimovig. Still needing imitrex. Sometimes has to take up to 150 mg total per 24 hours.   Past Medical History:  Diagnosis Date  . Acute respiratory failure with hypoxia (HCC) 02/01/2020  . Adjustment insomnia 12/11/2019  . AKI (acute kidney injury) (HCC) 10/02/2019  . COVID-19   . Diarrhea due to  COVID-19 10/02/2019  . Dyspnea on exertion 02/01/2020  . Elevated serum creatinine 12/11/2019  . Extreme exhaustion 11/24/2019  . Fatigue 02/01/2020  . Headache   . Heart palpitations 02/01/2020  . History of COVID-19 02/01/2020  . Hyperlipidemia   . Hypothyroidism   . Migraine headache 10/02/2019  . Mixed hyperlipidemia 11/16/2019  . Morbid (severe) obesity due to excess calories (HCC)   . Obesity (BMI 30-39.9) 10/02/2019  . Other chest pain 11/26/2019  . Physical deconditioning 02/01/2020  . 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 Sore throat 12/11/2019  . Tension-type headache, not intractable 11/26/2019   Past Surgical History:  Procedure Laterality Date  . CHOLECYSTECTOMY    . KNEE ARTHROSCOPY Left 08/13/2017   Procedure: ARTHROSCOPY KNEE;  Surgeon: Jodi Geralds, MD;  Location: [facility] OR;  Service: Orthopedics;  Laterality: Left;  . TUBAL LIGATION      Family History  Problem Relation Age of Onset  . Alzheimer's disease Mother   . Alzheimer's disease Father   . Colon cancer Father   . Colon cancer Sister   . Stroke Brother   . Colitis Sister   . Irritable bowel syndrome Sister    Social History   Socioeconomic History  . Marital status: Married    Spouse name: Not on file  . Number of children: 4  . Years of education: Not on file  . Highest education level: Not on file  Occupational History  . Occupation: Nurse  Tobacco Use  .  Smoking status: Never Smoker  . Smokeless tobacco: Never Used  Vaping Use  . Vaping Use: Never used  Substance and Sexual Activity  . Alcohol use: No  . Drug use: Never  . Sexual activity: Not on file  Other Topics Concern  . Not on file  Social History Narrative  . Not on file   Social Determinants of Health   Financial Resource Strain:   . Difficulty of Paying Living Expenses:   Food Insecurity:   . Worried About Programme researcher, broadcasting/film/video in the Last Year:   . Barista in the Last Year:   Transportation Needs:   . Freight forwarder  (Medical):   Marland Kitchen Lack of Transportation (Non-Medical):   Physical Activity:   . Days of Exercise per Week:   . Minutes of Exercise per Session:   Stress:   . Feeling of Stress :   Social Connections:   . Frequency of Communication with Friends and Family:   . Frequency of Social Gatherings with Friends and Family:   . Attends Religious Services:   . Active Member of Clubs or Organizations:   . Attends Banker Meetings:   Marland Kitchen Marital Status:     Review of Systems  Constitutional: Positive for fatigue. Negative for chills and fever.  HENT: Negative for congestion, ear pain, rhinorrhea and sore throat.   Respiratory: Positive for shortness of breath. Negative for cough.   Cardiovascular: Positive for palpitations (heart racing. Her heart rate is usually over 100 when she checks it. This is usually after she has been exerting herself. ). Negative for chest pain.  Gastrointestinal: Negative for abdominal pain, constipation, diarrhea, nausea and vomiting.  Genitourinary: Negative for difficulty urinating, dysuria and urgency.  Musculoskeletal: Positive for arthralgias (right hip pain ) and back pain. Negative for myalgias.  Neurological: Positive for weakness, light-headedness and headaches. Negative for dizziness.  Psychiatric/Behavioral: Positive for dysphoric mood (Irritated because she cannot do what she wants to do. ). The patient is not nervous/anxious.      Objective:  BP 108/76   Pulse 84   Temp (!) 97.2 F (36.2 C)   Ht 5\' 6"  (1.676 m)   Wt 232 lb 3.2 oz (105.3 kg)   BMI 37.48 kg/m   BP/Weight 03/05/2020 03/04/2020 02/23/2020  Systolic BP 124 108 110  Diastolic BP 80 76 80  Wt. (Lbs) 231 232.2 232  BMI 37.28 37.48 37.45    Physical Exam Vitals reviewed.  Constitutional:      Appearance: Normal appearance. She is obese.  Cardiovascular:     Rate and Rhythm: Normal rate and regular rhythm.     Heart sounds: Normal heart sounds.  Pulmonary:     Effort:  Pulmonary effort is normal. No respiratory distress.     Breath sounds: Normal breath sounds.  Abdominal:     General: Abdomen is flat. Bowel sounds are normal.     Palpations: Abdomen is soft.     Tenderness: There is no abdominal tenderness.  Neurological:     Mental Status: She is alert and oriented to person, place, and time.  Psychiatric:        Mood and Affect: Mood normal.        Behavior: Behavior normal.   6 minute walk - oxygen saturation 95%. It did not drop.   Lab Results  Component Value Date   WBC 7.4 02/01/2020   HGB 15.1 02/01/2020   HCT 44.7 02/01/2020   PLT 346  02/01/2020   GLUCOSE 96 02/23/2020   CHOL 282 (H) 03/04/2020   TRIG 196 (H) 03/04/2020   HDL 45 03/04/2020   LDLCALC 199 (H) 03/04/2020   ALT 19 02/01/2020   AST 25 02/01/2020   NA 139 02/23/2020   K 5.3 (H) 02/23/2020   CL 102 02/23/2020   CREATININE 1.44 (H) 02/23/2020   BUN 22 02/23/2020   CO2 23 02/23/2020   TSH 1.540 11/24/2019   HGBA1C 6.0 (H) 10/04/2019      Assessment & Plan:  1. Dyspnea due to COVID-19: pulse oximetry was maintained at 95% with 6 minute walk. 2. Extreme exhaustion 3. Palpitations 4. History of covid 19.  Continued management at the [facility], cardiology, and pulmonology.  5. Migraines Increase aimovig 140 mg once monthly. Continue imitrex. Start on nurtec. May use nurtec and imitrex on the same day.  6. Hyperlipidemia Poorly controlled.  Recommend start crestor 20 mg once daily. Continue to work on eating a healthy diet and start mild exercise.  Labs drawn today. - FLP: Results showed LDL 199. HDL 45, Trigs 196.   Follow-up: Return in about 3 months (around 06/04/2020) for fasting.  An After Visit Summary was printed and given to the patient.  Rochel Brome [facility] [phone]

## 2020-03-04 NOTE — Patient Instructions (Signed)
Continue aimovig 140 mg once monthly. Trial on nurtec for acute treatment of migraines. Call [facility] to get physical therapy.

## 2020-03-05 ENCOUNTER — Ambulatory Visit (INDEPENDENT_AMBULATORY_CARE_PROVIDER_SITE_OTHER): Payer: BC Managed Care – PPO | Admitting: Critical Care Medicine

## 2020-03-05 ENCOUNTER — Encounter: Payer: Self-pay | Admitting: Critical Care Medicine

## 2020-03-05 ENCOUNTER — Other Ambulatory Visit: Payer: Self-pay

## 2020-03-05 VITALS — BP 124/80 | HR 68 | Temp 97.6°F | Ht 66.0 in | Wt 231.0 lb

## 2020-03-05 DIAGNOSIS — R0600 Dyspnea, unspecified: Secondary | ICD-10-CM | POA: Diagnosis not present

## 2020-03-05 DIAGNOSIS — Z8616 Personal history of COVID-19: Secondary | ICD-10-CM

## 2020-03-05 DIAGNOSIS — R5381 Other malaise: Secondary | ICD-10-CM | POA: Diagnosis not present

## 2020-03-05 DIAGNOSIS — R0609 Other forms of dyspnea: Secondary | ICD-10-CM

## 2020-03-05 LAB — LIPID PANEL
Chol/HDL Ratio: 6.3 ratio — ABNORMAL HIGH (ref 0.0–4.4)
Cholesterol, Total: 282 mg/dL — ABNORMAL HIGH (ref 100–199)
HDL: 45 mg/dL (ref >39)
LDL Chol Calc (NIH): 199 mg/dL — ABNORMAL HIGH (ref 0–99)
Triglycerides: 196 mg/dL — ABNORMAL HIGH (ref 0–149)
VLDL Cholesterol Cal: 38 mg/dL (ref 5–40)

## 2020-03-05 LAB — CARDIOVASCULAR RISK ASSESSMENT

## 2020-03-05 MED ORDER — FLUTICASONE-SALMETEROL 250-50 MCG/DOSE IN AEPB: 1.0000 | INHALATION_SPRAY | Freq: Two times a day (BID) | RESPIRATORY_TRACT | 5 refills | Status: DC

## 2020-03-05 NOTE — Patient Instructions (Addendum)
Thank you for visiting Dr. Chestine Spore at [facility]. We recommend the following: Orders Placed This Encounter  Procedures   CT Chest High Resolution   Pulmonary function test   Orders Placed This Encounter  Procedures   CT Chest High Resolution    Standing Status:   Future    Standing Expiration Date:   03/05/2021    Order Specific Question:   ** REASON FOR EXAM (FREE TEXT)    Answer:   history of covid pneumonia    Order Specific Question:   Is patient pregnant?    Answer:   Unknown (Please Explain)    Order Specific Question:   Preferred imaging location?    Answer:   [facility]    Order Specific Question:   Radiology Contrast Protocol - do NOT remove file path    Answer:   \[email or web address]   Pulmonary function test    Standing Status:   Future    Standing Expiration Date:   03/05/2021    Order Specific Question:   Where should this test be performed?    Answer:   [facility]    Order Specific Question:   Full PFT: includes the following: basic spirometry, spirometry pre & post bronchodilator, diffusion capacity (DLCO), lung volumes    Answer:   Full PFT    Meds ordered this encounter  Medications   Fluticasone-Salmeterol (ADVAIR DISKUS) 250-50 MCG/DOSE AEPB    Sig: Inhale 1 puff into the lungs 2 (two) times daily.    Dispense:  60 each    Refill:  5    Return in about 2 months (around 05/05/2020).    Please do your part to reduce the spread of COVID-19.

## 2020-03-05 NOTE — Progress Notes (Signed)
Synopsis: Referred in June 2021 for post-Covid SOB by Ivonne Andrew, NP.  Subjective:   PATIENT ID: Taylor Ho GENDER: female DOB: 08/11/1966, MRN: [id number]  Chief Complaint  Patient presents with  . Consult    Diagnosed with Covid on 12/27 and does not remember anything from 12/27-1/3. Patient has shortness of breath with exertion. Patient has oxygen still but now only wears it as needed during the day and will sometimes sleep with it. 2 liters Sometimes has dry cough with exertion     Taylor Ho is a 54 y/o woman who was diagnosed with Covid in late December 2020 who presents for evaluation of persistent DOE. She was hospitalized for hypoxic respiratory failure due to pneumonia from 1/4-1/9 and received remdesivir, Decadron, IV steroids. She was discharged on 2L HOT which she was previously using continuously. She monitors her saturations but only uses intermittently now. She uses albuterol PRN with some improvement in her symptoms. She has a persistent dry cough and wheezing. She uses albuterol every other day. Over time her SOB has slowly improved, but she still has significant activity limtiations and needs oxygen when she over-exerts herself. Two days ago her saturations dropped to 81%. She recently was walked during an office visit without desaturation.  She has occasional palpitations, which she is being seen by Cardiology to evaluate. She previously had non-restorative sleep, which has improved since starting amitriptyline.  No snoring or witnessed apneas. Post-covid clinic notes reviewed in Epic.  Evaluated by cardiology 5/28, Dr. Anne Fu note reviewed0 palpitations, racing heartbeat. Echo, CTA coronary, and Zio patch monitoring ordered--> wearing now  No family history of lung disease. Never smoker. Has received covid vaccines.      Past Medical History:  Diagnosis Date  . Acute respiratory failure with hypoxia (HCC) 02/01/2020  . Adjustment insomnia 12/11/2019  . AKI  (acute kidney injury) (HCC) 10/02/2019  . COVID-19   . Diarrhea due to COVID-19 10/02/2019  . Dyspnea on exertion 02/01/2020  . Elevated serum creatinine 12/11/2019  . Extreme exhaustion 11/24/2019  . Fatigue 02/01/2020  . Headache   . Heart palpitations 02/01/2020  . History of COVID-19 02/01/2020  . Hyperlipidemia   . Hypothyroidism   . Migraine headache 10/02/2019  . Mixed hyperlipidemia 11/16/2019  . Morbid (severe) obesity due to excess calories (HCC)   . Obesity (BMI 30-39.9) 10/02/2019  . Other chest pain 11/26/2019  . Physical deconditioning 02/01/2020  . 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 Sore throat 12/11/2019  . Tension-type headache, not intractable 11/26/2019     Family History  Problem Relation Age of Onset  . Alzheimer's disease Mother   . Alzheimer's disease Father   . Colon cancer Father   . Colon cancer Sister   . Stroke Brother   . Colitis Sister   . Irritable bowel syndrome Sister      Past Surgical History:  Procedure Laterality Date  . CHOLECYSTECTOMY    . KNEE ARTHROSCOPY Left 08/13/2017   Procedure: ARTHROSCOPY KNEE;  Surgeon: Jodi Geralds, MD;  Location: [facility] OR;  Service: Orthopedics;  Laterality: Left;  . TUBAL LIGATION      Social History   Socioeconomic History  . Marital status: Married    Spouse name: Not on file  . Number of children: 4  . Years of education: Not on file  . Highest education level: Not on file  Occupational History  . Occupation: Nurse  Tobacco Use  . Smoking status: Never Smoker  . Smokeless tobacco: Never  Used  Substance and Sexual Activity  . Alcohol use: No  . Drug use: Never  . Sexual activity: Not on file  Other Topics Concern  . Not on file  Social History Narrative  . Not on file   Social Determinants of Health   Financial Resource Strain:   . Difficulty of Paying Living Expenses:   Food Insecurity:   . Worried About Programme researcher, broadcasting/film/video in the Last Year:   . Barista in the Last Year:   Transportation  Needs:   . Freight forwarder (Medical):   Marland Kitchen Lack of Transportation (Non-Medical):   Physical Activity:   . Days of Exercise per Week:   . Minutes of Exercise per Session:   Stress:   . Feeling of Stress :   Social Connections:   . Frequency of Communication with Friends and Family:   . Frequency of Social Gatherings with Friends and Family:   . Attends Religious Services:   . Active Member of Clubs or Organizations:   . Attends Banker Meetings:   Marland Kitchen Marital Status:   Intimate Partner Violence:   . Fear of Current or Ex-Partner:   . Emotionally Abused:   Marland Kitchen Physically Abused:   . Sexually Abused:      No Known Allergies   Immunization History  Administered Date(s) Administered  . Influenza-Unspecified 07/07/2019  . PFIZER SARS-COV-2 Vaccination 11/10/2019, 12/01/2019    Outpatient Medications Prior to Visit  Medication Sig Dispense Refill  . albuterol (VENTOLIN HFA) 108 (90 Base) MCG/ACT inhaler Inhale 2 puffs into the lungs every 6 (six) hours as needed for wheezing or shortness of breath. 6.7 g 1  . amitriptyline (ELAVIL) 25 MG tablet Take 1 tablet (25 mg total) by mouth at bedtime. 30 tablet 3  . Erenumab-aooe (AIMOVIG) 140 MG/ML SOAJ Inject 140 mg into the skin every 30 (thirty) days. 140 mg 2  . levothyroxine (SYNTHROID) 112 MCG tablet Take 1 tablet (112 mcg total) by mouth daily. 90 tablet 0  . meloxicam (MOBIC) 7.5 MG tablet Take 1 tablet (7.5 mg total) by mouth daily. 180 tablet 0  . metoprolol tartrate (LOPRESSOR) 100 MG tablet Take one tablet two hours prior to CT procedure. 1 tablet 0  . nadolol (CORGARD) 80 MG tablet Take 2 tablets by mouth once daily 60 tablet 0  . Rimegepant Sulfate (NURTEC) 75 MG TBDP Take at onset of migraine/aura. May NOT repeat dosing for 24 hrs. 10 tablet 1  . SUMAtriptan (IMITREX) 100 MG tablet Take 100 mg every 2 (two) hours as needed by mouth for migraine. May repeat in 2 hours if headache persists or recurs.    . Vitamin  D, Ergocalciferol, (DRISDOL) 1.25 MG (50000 UNIT) CAPS capsule Take 1 capsule (50,000 Units total) by mouth every 7 (seven) days. 15 capsule 0   No facility-administered medications prior to visit.    Review of Systems  Constitutional: Negative for chills and fever.       Chronic fatigue  HENT: Negative.   Respiratory: Positive for cough, shortness of breath and wheezing.   Cardiovascular: Positive for palpitations. Negative for leg swelling.  Gastrointestinal: Negative for heartburn, nausea and vomiting.  Endo/Heme/Allergies: Negative for environmental allergies.     Objective:   Vitals:   03/05/20 1142  BP: 124/80  Pulse: 68  Temp: 97.6 F (36.4 C)  TempSrc: Oral  SpO2: 95%  Weight: 231 lb (104.8 kg)  Height: 5\' 6"  (1.676 m)   95% on  RA BMI Readings from Last 3 Encounters:  03/05/20 37.28 kg/m  03/04/20 37.48 kg/m  02/23/20 37.45 kg/m   Wt Readings from Last 3 Encounters:  03/05/20 231 lb (104.8 kg)  03/04/20 232 lb 3.2 oz (105.3 kg)  02/23/20 232 lb (105.2 kg)    Physical Exam Vitals reviewed.  Constitutional:      Appearance: She is obese. She is not ill-appearing.  HENT:     Head: Normocephalic and atraumatic.  Eyes:     General: No scleral icterus. Cardiovascular:     Rate and Rhythm: Normal rate and regular rhythm.     Heart sounds: No murmur.  Pulmonary:     Comments: CTAB, breathing comfortably on RA, speaking in full sentences Abdominal:     General: There is no distension.     Palpations: Abdomen is soft.     Tenderness: There is no abdominal tenderness.  Musculoskeletal:        General: No swelling or deformity.     Cervical back: Neck supple.  Lymphadenopathy:     Cervical: No cervical adenopathy.  Skin:    General: Skin is warm and dry.     Findings: No rash.  Neurological:     General: No focal deficit present.     Mental Status: She is alert.     Coordination: Coordination normal.  Psychiatric:        Mood and Affect: Mood  normal.        Behavior: Behavior normal.      CBC    Component Value Date/Time   WBC 7.4 02/01/2020 1045   WBC 13.8 (H) 10/07/2019 0151   RBC 5.13 02/01/2020 1045   RBC 5.14 (H) 10/07/2019 0151   HGB 15.1 02/01/2020 1045   HCT 44.7 02/01/2020 1045   PLT 346 02/01/2020 1045   MCV 87 02/01/2020 1045   MCH 29.4 02/01/2020 1045   MCH 28.2 10/07/2019 0151   MCHC 33.8 02/01/2020 1045   MCHC 33.3 10/07/2019 0151   RDW 12.5 02/01/2020 1045   LYMPHSABS 2.0 02/01/2020 1045   MONOABS 0.9 10/07/2019 0151   EOSABS 0.2 02/01/2020 1045   BASOSABS 0.1 02/01/2020 1045    CHEMISTRY No results for input(s): NA, K, CL, CO2, GLUCOSE, BUN, CREATININE, CALCIUM, MG, PHOS in the last 168 hours. Estimated Creatinine Clearance: 55.3 mL/min (A) (by C-G formula based on SCr of 1.44 mg/dL (H)).   Chest Imaging- films reviewed: CXR, 2 view 02/01/2020-increased lung markings bilateral bases.  No masses or opacities.  Pulmonary Functions Testing Results: No flowsheet data found.      Assessment & Plan:     ICD-10-CM   1. DOE (dyspnea on exertion)  R06.00 Pulmonary function test    CT Chest High Resolution  2. History of COVID-19  Z86.16 Pulmonary function test    CT Chest High Resolution  3. Physical deconditioning  R53.81    Chronic cough, DOE, wheezing post-covid pneumonia -HRCT chest -PFTs -trial of Advair BID -albuterol Q4h PRN -supplemental O2 as needed; con't monitoring home saturations -recommend modest frequent physical activity as she can tolerate to regain exercise tolerance.  Deconditioning after prolonged illness -recommend physical activity that is modest and frequent; discussed that limiting symptoms are CP, lightheadedness, or dizziness. SOB does not have to be a limiting symptom.  RTC in 2 months after PFTs.   Current Outpatient Medications:  .  albuterol (VENTOLIN HFA) 108 (90 Base) MCG/ACT inhaler, Inhale 2 puffs into the lungs every 6 (six) hours as needed  for  wheezing or shortness of breath., Disp: 6.7 g, Rfl: 1 .  amitriptyline (ELAVIL) 25 MG tablet, Take 1 tablet (25 mg total) by mouth at bedtime., Disp: 30 tablet, Rfl: 3 .  Erenumab-aooe (AIMOVIG) 140 MG/ML SOAJ, Inject 140 mg into the skin every 30 (thirty) days., Disp: 140 mg, Rfl: 2 .  levothyroxine (SYNTHROID) 112 MCG tablet, Take 1 tablet (112 mcg total) by mouth daily., Disp: 90 tablet, Rfl: 0 .  meloxicam (MOBIC) 7.5 MG tablet, Take 1 tablet (7.5 mg total) by mouth daily., Disp: 180 tablet, Rfl: 0 .  metoprolol tartrate (LOPRESSOR) 100 MG tablet, Take one tablet two hours prior to CT procedure., Disp: 1 tablet, Rfl: 0 .  nadolol (CORGARD) 80 MG tablet, Take 2 tablets by mouth once daily, Disp: 60 tablet, Rfl: 0 .  Rimegepant Sulfate (NURTEC) 75 MG TBDP, Take at onset of migraine/aura. May NOT repeat dosing for 24 hrs., Disp: 10 tablet, Rfl: 1 .  SUMAtriptan (IMITREX) 100 MG tablet, Take 100 mg every 2 (two) hours as needed by mouth for migraine. May repeat in 2 hours if headache persists or recurs., Disp: , Rfl:  .  Vitamin D, Ergocalciferol, (DRISDOL) 1.25 MG (50000 UNIT) CAPS capsule, Take 1 capsule (50,000 Units total) by mouth every 7 (seven) days., Disp: 15 capsule, Rfl: 0 .  Fluticasone-Salmeterol (ADVAIR DISKUS) 250-50 MCG/DOSE AEPB, Inhale 1 puff into the lungs 2 (two) times daily., Disp: 60 each, Rfl: 5      Steffanie Dunn, DO [facility] 03/05/2020 12:07 PM

## 2020-03-06 DIAGNOSIS — U071 COVID-19: Secondary | ICD-10-CM | POA: Diagnosis not present

## 2020-03-10 ENCOUNTER — Encounter: Payer: Self-pay | Admitting: Family Medicine

## 2020-03-10 DIAGNOSIS — G43019 Migraine without aura, intractable, without status migrainosus: Secondary | ICD-10-CM | POA: Insufficient documentation

## 2020-03-10 MED ORDER — ROSUVASTATIN CALCIUM 20 MG PO TABS: 20.0000 mg | ORAL_TABLET | Freq: Every day | ORAL | 0 refills | Status: DC

## 2020-03-13 ENCOUNTER — Ambulatory Visit: Payer: BC Managed Care – PPO | Admitting: Physical Therapy

## 2020-03-14 ENCOUNTER — Ambulatory Visit: Payer: BC Managed Care – PPO | Admitting: Rehabilitative and Restorative Service Providers"

## 2020-03-14 ENCOUNTER — Ambulatory Visit (INDEPENDENT_AMBULATORY_CARE_PROVIDER_SITE_OTHER)
Admission: RE | Admit: 2020-03-14 | Discharge: 2020-03-14 | Disposition: A | Discharge status: Home / Self Care | Payer: BC Managed Care – PPO | Source: Ambulatory Visit | Attending: Critical Care Medicine | Admitting: Critical Care Medicine

## 2020-03-14 ENCOUNTER — Other Ambulatory Visit: Payer: Self-pay

## 2020-03-14 ENCOUNTER — Ambulatory Visit (HOSPITAL_COMMUNITY): Attending: Cardiovascular Disease

## 2020-03-14 DIAGNOSIS — R0600 Dyspnea, unspecified: Secondary | ICD-10-CM | POA: Diagnosis not present

## 2020-03-14 DIAGNOSIS — R0789 Other chest pain: Secondary | ICD-10-CM | POA: Insufficient documentation

## 2020-03-14 DIAGNOSIS — R918 Other nonspecific abnormal finding of lung field: Secondary | ICD-10-CM | POA: Diagnosis not present

## 2020-03-14 DIAGNOSIS — Z8616 Personal history of COVID-19: Secondary | ICD-10-CM | POA: Diagnosis not present

## 2020-03-14 DIAGNOSIS — E7800 Pure hypercholesterolemia, unspecified: Secondary | ICD-10-CM

## 2020-03-14 DIAGNOSIS — R0609 Other forms of dyspnea: Secondary | ICD-10-CM

## 2020-03-14 DIAGNOSIS — R002 Palpitations: Secondary | ICD-10-CM

## 2020-03-14 DIAGNOSIS — R0602 Shortness of breath: Secondary | ICD-10-CM

## 2020-03-15 ENCOUNTER — Telehealth: Payer: Self-pay | Admitting: Critical Care Medicine

## 2020-03-15 ENCOUNTER — Telehealth (HOSPITAL_COMMUNITY): Payer: Self-pay | Admitting: *Deleted

## 2020-03-15 ENCOUNTER — Telehealth: Payer: Self-pay | Admitting: Cardiology

## 2020-03-15 ENCOUNTER — Ambulatory Visit: Attending: Nurse Practitioner | Admitting: Physical Therapy

## 2020-03-15 ENCOUNTER — Encounter: Payer: Self-pay | Admitting: Physical Therapy

## 2020-03-15 DIAGNOSIS — R262 Difficulty in walking, not elsewhere classified: Secondary | ICD-10-CM | POA: Insufficient documentation

## 2020-03-15 DIAGNOSIS — M6281 Muscle weakness (generalized): Secondary | ICD-10-CM | POA: Diagnosis not present

## 2020-03-15 DIAGNOSIS — R29898 Other symptoms and signs involving the musculoskeletal system: Secondary | ICD-10-CM | POA: Diagnosis not present

## 2020-03-15 DIAGNOSIS — Z8616 Personal history of COVID-19: Secondary | ICD-10-CM

## 2020-03-15 DIAGNOSIS — R918 Other nonspecific abnormal finding of lung field: Secondary | ICD-10-CM

## 2020-03-15 NOTE — Telephone Encounter (Signed)
Chest CT ordered.  Steffanie Dunn, DO 03/15/20 2:34 PM [facility]

## 2020-03-15 NOTE — Telephone Encounter (Signed)
Pt aware of echo results ./cy 

## 2020-03-15 NOTE — Telephone Encounter (Signed)
Attempted to call patient regarding upcoming cardiac CT appointment. Left message on voicemail with name and callback number  Nayla Dias Tai RN Navigator Cardiac Imaging [facility] [phone] Office [phone] Cell  

## 2020-03-15 NOTE — Patient Instructions (Signed)
Access Code: [facility] URL: [email or web address] Date: 03/15/2020 Prepared by: Vernon Prey April Kirstie Peri  Exercises Hip Abduction with Resistance Loop - 1 x daily - 7 x weekly - 3 sets - 10 reps Hip Extension with Resistance Loop - 1 x daily - 7 x weekly - 3 sets - 10 reps Squat - 1 x daily - 7 x weekly - 3 sets - 10 reps Wall Push Up - 1 x daily - 7 x weekly - 3 sets - 10 reps Standing Bilateral Low Shoulder Row with Anchored Resistance - 1 x daily - 7 x weekly - 3 sets - 10 reps

## 2020-03-15 NOTE — Progress Notes (Signed)
Please let Ms. Serpas know that her CT scan may have very minimal scarring from covid, but she has some spots that we will watch on a 3 month follow up CT scan. Thanks!

## 2020-03-15 NOTE — Therapy (Signed)
[facility] Outpatient Rehabilitation [facility] [address] [place], [place], [address] Phone: [phone]   Fax:  [phone]  Physical Therapy Evaluation  Patient Details  Name: Taylor Ho MRN: [id number] Date of Birth: 12/31/1965 Referring Provider (PT): Ivonne Andrew, NP   Encounter Date: 03/15/2020   PT End of Session - 03/15/20 0835    Visit Number 1    Number of Visits 6    Date for PT Re-Evaluation 04/26/20    Authorization Type BCBS    PT Start Time 0835    PT Stop Time 0925    PT Time Calculation (min) 50 min    Activity Tolerance Patient tolerated treatment well    Behavior During Therapy [facility] for tasks assessed/performed           Past Medical History:  Diagnosis Date  . Acute respiratory failure with hypoxia (HCC) 02/01/2020  . Acute respiratory failure with hypoxia (HCC) 02/01/2020  . Adjustment insomnia 12/11/2019  . AKI (acute kidney injury) (HCC) 10/02/2019  . COVID-19   . Diarrhea due to COVID-19 10/02/2019  . Dyspnea on exertion 02/01/2020  . Elevated serum creatinine 12/11/2019  . Extreme exhaustion 11/24/2019  . Fatigue 02/01/2020  . Headache   . Heart palpitations 02/01/2020  . History of COVID-19 02/01/2020  . Hyperlipidemia   . Hypothyroidism   . Migraine headache 10/02/2019  . Mixed hyperlipidemia 11/16/2019  . Morbid (severe) obesity due to excess calories (HCC)   . Obesity (BMI 30-39.9) 10/02/2019  . Other chest pain 11/26/2019  . Physical deconditioning 02/01/2020  . 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 Sore throat 12/11/2019  . Tension-type headache, not intractable 11/26/2019  . Tension-type headache, not intractable 11/26/2019    Past Surgical History:  Procedure Laterality Date  . CHOLECYSTECTOMY    . KNEE ARTHROSCOPY Left 08/13/2017   Procedure: ARTHROSCOPY KNEE;  Surgeon: Jodi Geralds, MD;  Location: [facility] OR;  Service: Orthopedics;  Laterality: Left;  . TUBAL LIGATION      There were no vitals filed for this visit.     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 Pt states when she got d/c-ed she needed O2. Pt was barely able to do any ADLs. Pt states she has tried to walk more but does get [facility] easily. Pt has been seen by a specialized after-care COVID clinic and has an appointment to see a pulmonologist. Pt notes that her O2sats don't drop as much as they used to (used to drop to low 80s, now will drop to 91 or 89%). Pt reports she got a CT of her lungs but she hasn't gotten her results back.    Pertinent History COVID    Limitations Walking;House hold activities    Diagnostic tests CT and XR    Patient Stated Goals Improve endurance and activity tolerance for daily tasks and return to work    Currently in Pain? No/denies              [facility] PT Assessment - 03/15/20 0001      Assessment   Medical Diagnosis R06.00 (ICD-10-CM) - Dyspnea on exertion    Referring Provider (PT) Ivonne Andrew, NP      Balance Screen   Has the patient fallen in the past 6 months No      Prior Function   Level of  Independence Independent    Vocation Full time employment   Hospice nurse -- not currently working     Observation/Other Assessments   Focus on Therapeutic Outcomes (FOTO)  54%      Squat   Comments 10# weight x 10 reps   5/10 RPE, 97% spO2, HR 78     Strength   Overall Strength Comments --   0/10 RPE   Right Hip Flexion 4+/5    Right Hip ABduction 4/5    Left Hip Flexion 4+/5    Left Hip ABduction 4/5    Right Knee Flexion 5/5    Right Knee Extension 5/5    Left Knee Flexion 5/5    Left Knee Extension 5/5    Right Ankle Dorsiflexion 5/5    Right Ankle Plantar Flexion 5/5    Left Ankle Dorsiflexion 5/5    Left Ankle Plantar Flexion 5/5      Transfers   Five time sit to stand comments  15 sec   pre HR 69, 94% spO2, 0/10 RPE; post HR 69,94% spO2  2/10 RPE     6 Minute Walk- Baseline   6 Minute Walk- Baseline yes    HR (bpm) 69    02 Sat (%RA) 94 %    Modified Borg Scale for Dyspnea 0- Nothing at all      6 Minute walk- Post Test   6 Minute Walk Post Test yes   1.3 speed, 5% grade,    HR (bpm) 80    02 Sat (%RA) 98 %    Modified Borg Scale for Dyspnea 5- Strong or hard breathing      6 minute walk test results    Aerobic Endurance Distance Walked --   .1 miles                     Objective measurements completed on examination: See above findings.       [facility] Adult PT Treatment/Exercise - 03/15/20 0001      Exercises   Exercises Shoulder      Knee/Hip Exercises: Standing   Hip Abduction Stengthening;Both;10 reps    Hip Extension Stengthening;Both;10 reps      Shoulder Exercises: Standing   Row Strengthening;Both;10 reps   green tband   Other Standing Exercises Wall push-up x 10 reps                  PT Education - 03/15/20 0947    Education Details Educated pt on endurance, monitoring HR, spO2 and RPE for exercise. Discussed strength training and diaphragmatic breathing.    Person(s) Educated Patient    Methods Explanation;Demonstration;Handout;Verbal cues    Comprehension Verbalized understanding;Returned demonstration;Tactile cues required            PT Short Term Goals - 03/15/20 1210      PT SHORT TERM GOAL #1   Title Pt will have RPE of </= 3/10 after performing 10 squats with 10# weight    Baseline RPE of 5/10 after 10 squats with 10# weight    Time 3    Period Weeks    Status New    Target Date 04/05/20             PT Long Term Goals - 03/15/20 1217      PT LONG TERM GOAL #1   Title Pt will be independent with HEP    Baseline New    Time 6    Period Weeks  Status New    Target Date 04/26/20      PT LONG TERM GOAL #2   Title Pt will be able to walk 6 minutes on treadmill with 5% elevation at 1.3 mph with RPE </= 2/10    Baseline RPE 5/10    Time 6     Period Weeks    Status New    Target Date 04/26/20      PT LONG TERM GOAL #3   Title Pt will be able to tolerate ambulating 1 mile with RPE <2/10    Baseline Unable    Time 6    Period Weeks    Status New    Target Date 04/26/20      PT LONG TERM GOAL #4   Title Pt will have improved FOTO score to </= 40% impairment    Baseline 54% impairment    Time 6    Period Weeks    Status New    Target Date 04/26/20                  Plan - 03/15/20 0948    Clinical Impression Statement Pt is a 55 y/o F presenting to [facility] s/p COVID on 12/27 with long lasting fatigue and deconditioning. Pt demonstrates general decreased muscle endurance, strength, and activity tolerance limiting pt's ability to perform household and community tasks. Pt would benefit from therapy to address these issues and return to PLOF. SpO2 and HR monitored throughout eval and remained stable.    Personal Factors and Comorbidities Fitness;Comorbidity 1;Time since onset of injury/illness/exacerbation    Comorbidities COVID    Examination-Activity Limitations Caring for Others;Hygiene/Grooming;Lift;Stairs;Locomotion Level    Examination-Participation Restrictions Estate agent;Shop;Yard Work    Stability/Clinical Decision Making Stable/Uncomplicated    Clinical Decision Making Low    Rehab Potential Good    PT Frequency 1x / week    PT Duration 6 weeks    PT Treatment/Interventions ADLs/Self Care Home Management;Cryotherapy;Electrical Stimulation;Iontophoresis 4mg /ml Dexamethasone;Moist Heat;Gait training;Stair training;Functional mobility training;Therapeutic activities;Therapeutic exercise;Balance training;Neuromuscular re-education;Patient/family education;Manual techniques;Passive range of motion;Dry needling;Energy conservation;Taping    PT Next Visit Plan Assess response to strengthening exercise. Continue general strengthening and endurance.    PT Home Exercise Plan Access Code [facility]     Consulted and Agree with Plan of Care Patient           Patient will benefit from skilled therapeutic intervention in order to improve the following deficits and impairments:  Difficulty walking, Decreased endurance, Decreased activity tolerance  Visit Diagnosis: Difficulty in walking, not elsewhere classified - Plan: PT plan of care cert/re-cert  Muscle weakness (generalized) - Plan: PT plan of care cert/re-cert  Other symptoms and signs involving the musculoskeletal system - Plan: PT plan of care cert/re-cert  History of [id number] - Plan: PT plan of care cert/re-cert     Problem List Patient Active Problem List   Diagnosis Date Noted  . Intractable migraine without aura and without status migrainosus 03/10/2020  . Palpitations 02/01/2020  . Dyspnea on exertion 02/01/2020  . History of COVID-19 02/01/2020  . Other fatigue 02/01/2020  . Physical deconditioning 02/01/2020  . Adjustment insomnia 12/11/2019  . Other chest pain 11/26/2019  . Extreme exhaustion 11/24/2019  . Mixed hyperlipidemia 11/16/2019  . Migraine headache 10/02/2019  . 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 Obesity (BMI 30-39.9) 10/02/2019    Kowen Kluth April Gordy Levan PT, DPT 03/15/2020, 12:29 PM  [facility] [address] [place], [place], [address] Phone: [phone]  Fax:  [phone]  Name: Taylor Ho MRN: [id number] Date of Birth: 02-27-1966

## 2020-03-15 NOTE — Telephone Encounter (Signed)
    Pt returning call to get echo results

## 2020-03-15 NOTE — Telephone Encounter (Signed)
Pt returning call regarding upcoming cardiac imaging study; pt verbalizes understanding of appt date/time, parking situation and where to check in, pre-test NPO status and medications ordered, and verified current allergies; name and call back number provided for further questions should they arise  [facility] [phone] office [phone] cell  Pt verbalized understanding to come to [facility] on 6/22 for IV hydration at medical day.

## 2020-03-19 ENCOUNTER — Ambulatory Visit (HOSPITAL_COMMUNITY)
Admission: RE | Admit: 2020-03-19 | Discharge: 2020-03-19 | Disposition: A | Discharge status: Home / Self Care | Source: Ambulatory Visit | Attending: Cardiology | Admitting: Cardiology

## 2020-03-19 ENCOUNTER — Other Ambulatory Visit: Payer: Self-pay

## 2020-03-19 DIAGNOSIS — R079 Chest pain, unspecified: Secondary | ICD-10-CM | POA: Diagnosis not present

## 2020-03-19 DIAGNOSIS — R0602 Shortness of breath: Secondary | ICD-10-CM

## 2020-03-19 LAB — BASIC METABOLIC PANEL
Anion gap: 8 (ref 5–15)
BUN: 20 mg/dL (ref 6–20)
CO2: 25 mmol/L (ref 22–32)
Calcium: 9 mg/dL (ref 8.9–10.3)
Chloride: 108 mmol/L (ref 98–111)
Creatinine, Ser: 1.36 mg/dL — ABNORMAL HIGH (ref 0.44–1.00)
GFR calc Af Amer: 51 mL/min — ABNORMAL LOW (ref >60)
GFR calc non Af Amer: 44 mL/min — ABNORMAL LOW (ref >60)
Glucose, Bld: 93 mg/dL (ref 70–99)
Potassium: 4.4 mmol/L (ref 3.5–5.1)
Sodium: 141 mmol/L (ref 135–145)

## 2020-03-19 MED ORDER — NITROGLYCERIN 0.4 MG SL SUBL
SUBLINGUAL_TABLET | SUBLINGUAL | Status: AC
  Administered 2020-03-19: 0.8 mg
  Filled 2020-03-19: qty 2

## 2020-03-19 MED ORDER — IOHEXOL 350 MG/ML SOLN
100.0000 mL | Freq: Once | INTRAVENOUS | Status: AC | PRN
  Administered 2020-03-19: 100 mL via INTRAVENOUS

## 2020-03-19 MED ORDER — NITROGLYCERIN 0.4 MG SL SUBL: 0.8000 mg | SUBLINGUAL_TABLET | Freq: Once | SUBLINGUAL | Status: DC

## 2020-03-19 MED ORDER — SODIUM CHLORIDE 0.9 % WEIGHT BASED INFUSION: 1.0000 mL/kg/h | INTRAVENOUS | Status: DC

## 2020-03-19 MED ORDER — SODIUM CHLORIDE 0.9 % WEIGHT BASED INFUSION
3.0000 mL/kg/h | INTRAVENOUS | Status: AC
  Administered 2020-03-19: 3 mL/kg/h via INTRAVENOUS

## 2020-03-20 ENCOUNTER — Telehealth: Payer: Self-pay | Admitting: Cardiology

## 2020-03-20 NOTE — Telephone Encounter (Signed)
Pt aware of lab results ./cy 

## 2020-03-20 NOTE — Telephone Encounter (Signed)
OK for CT scan. Creatinine was previously 1.45. Hydrate prior.

## 2020-03-20 NOTE — Telephone Encounter (Signed)
Follow Up:    Pt is returning Christine's call, concerning her lab results.

## 2020-03-21 ENCOUNTER — Other Ambulatory Visit: Payer: Self-pay | Admitting: Family Medicine

## 2020-03-21 ENCOUNTER — Ambulatory Visit: Admitting: Physical Therapy

## 2020-03-21 ENCOUNTER — Other Ambulatory Visit: Payer: Self-pay

## 2020-03-21 DIAGNOSIS — R262 Difficulty in walking, not elsewhere classified: Secondary | ICD-10-CM | POA: Diagnosis not present

## 2020-03-21 DIAGNOSIS — M6281 Muscle weakness (generalized): Secondary | ICD-10-CM | POA: Diagnosis not present

## 2020-03-21 DIAGNOSIS — R29898 Other symptoms and signs involving the musculoskeletal system: Secondary | ICD-10-CM | POA: Diagnosis not present

## 2020-03-21 DIAGNOSIS — Z8616 Personal history of COVID-19: Secondary | ICD-10-CM | POA: Diagnosis not present

## 2020-03-21 NOTE — Therapy (Signed)
[place], [place], [address] Phone: [phone]   Fax:  [phone]  Physical Therapy Treatment  Patient Details  Name: Taylor Ho MRN: [id number] Date of Birth: 1966-05-19 Referring Provider (PT): Fenton Foy, NP   Encounter Date: 03/21/2020   PT End of Session - 03/21/20 1359    Visit Number 2    Number of Visits 6    Date for PT Re-Evaluation 04/26/20    Authorization Type BCBS    PT Start Time 1314    PT Stop Time 1359    PT Time Calculation (min) 45 min    Activity Tolerance Patient tolerated treatment well    Behavior During Therapy [facility] for tasks assessed/performed           Past Medical History:  Diagnosis Date  . Acute respiratory failure with hypoxia ([place]) 02/01/2020  . Acute respiratory failure with hypoxia ([place]) 02/01/2020  . Adjustment insomnia 12/11/2019  . AKI (acute kidney injury) ([place]) 10/02/2019  . COVID-19   . Diarrhea due to COVID-19 10/02/2019  . Dyspnea on exertion 02/01/2020  . Elevated serum creatinine 12/11/2019  . Extreme exhaustion 11/24/2019  . Fatigue 02/01/2020  . Headache   . Heart palpitations 02/01/2020  . History of COVID-19 02/01/2020  . Hyperlipidemia   . Hypothyroidism   . Migraine headache 10/02/2019  . Mixed hyperlipidemia 11/16/2019  . Morbid (severe) obesity due to excess calories ([place])   . Obesity (BMI 30-39.9) 10/02/2019  . Other chest pain 11/26/2019  . Physical deconditioning 02/01/2020  . 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 Sore throat 12/11/2019  . Tension-type headache, not intractable 11/26/2019  . Tension-type headache, not intractable 11/26/2019    Past Surgical History:  Procedure Laterality Date  . CHOLECYSTECTOMY    . KNEE ARTHROSCOPY Left 08/13/2017   Procedure: ARTHROSCOPY KNEE;  Surgeon: Dorna Leitz, MD;  Location: [facility];  Service: Orthopedics;  Laterality: Left;  . TUBAL LIGATION      There were no vitals filed for this visit.   Subjective  Assessment - 03/21/20 1316    Subjective Pt states she has been sore from doing the exercises but can feel a slight difference in her activity level. Pt states that her spO2 has been okay with the exercises but there were a few times that her RPE was 8/10 (particularly the squat).    Pertinent History COVID    Limitations Walking;House hold activities    Diagnostic tests CT and XR    Patient Stated Goals Improve endurance and activity tolerance for daily tasks and return to work                             [facility] Adult PT Treatment/Exercise - 03/21/20 0001      Knee/Hip Exercises: Aerobic   Nustep L5 x 5 min      Knee/Hip Exercises: Standing   Heel Raises Both;3 sets;10 reps    Functional Squat 10 reps   10# weight   Other Standing Knee Exercises Xband walks with green tband x 50'    Other Standing Knee Exercises Plank 3 sets x 20 sec, side planks bilat 3 sets x 10 sec      Knee/Hip Exercises: Seated   Other Seated Knee/Hip Exercises Leg press 3 sets x 10 reps 40#      Shoulder Exercises: Seated   Other Seated Exercises Lat pull downs 3 sets x 10 reps 35#  Shoulder Exercises: Standing   Other Standing Exercises Counter push-up 2 sets x 5 reps      Shoulder Exercises: Stretch   Other Shoulder Stretches doorway stretch mid & high x 30 sec each                  PT Education - 03/21/20 1401    Education Details Disussed alternating LE and UE exercise days to reduce soreness.    Person(s) Educated Patient    Methods Explanation;Demonstration;Tactile cues;Handout;Verbal cues    Comprehension Verbalized understanding;Returned demonstration;Verbal cues required;Tactile cues required            PT Short Term Goals - 03/15/20 1210      PT SHORT TERM GOAL #1   Title Pt will have RPE of </= 3/10 after performing 10 squats with 10# weight    Baseline RPE of 5/10 after 10 squats with 10# weight    Time 3    Period Weeks    Status New    Target Date  04/05/20             PT Long Term Goals - 03/15/20 1217      PT LONG TERM GOAL #1   Title Pt will be independent with HEP    Baseline New    Time 6    Period Weeks    Status New    Target Date 04/26/20      PT LONG TERM GOAL #2   Title Pt will be able to walk 6 minutes on treadmill with 5% elevation at 1.3 mph with RPE </= 2/10    Baseline RPE 5/10    Time 6    Period Weeks    Status New    Target Date 04/26/20      PT LONG TERM GOAL #3   Title Pt will be able to tolerate ambulating 1 mile with RPE <2/10    Baseline Unable    Time 6    Period Weeks    Status New    Target Date 04/26/20      PT LONG TERM GOAL #4   Title Pt will have improved FOTO score to </= 40% impairment    Baseline 54% impairment    Time 6    Period Weeks    Status New    Target Date 04/26/20                 Plan - 03/21/20 1400    Clinical Impression Statement Pt with progressing strength. RPE at most 5/10 with exercises today. Treatment focused on whole body heavy strengthening to continue improvements with her deconditioning. SpO2 and HR stable.    Personal Factors and Comorbidities Fitness;Comorbidity 1;Time since onset of injury/illness/exacerbation    Comorbidities COVID    Examination-Activity Limitations Caring for Others;Hygiene/Grooming;Lift;Stairs;Locomotion Level    Examination-Participation Restrictions Psychiatric nurse;Shop;Yard Work    Stability/Clinical Decision Making Stable/Uncomplicated    Rehab Potential Good    PT Frequency 1x / week    PT Duration 6 weeks    PT Treatment/Interventions ADLs/Self Care Home Management;Cryotherapy;Electrical Stimulation;Iontophoresis 4mg /ml Dexamethasone;Moist Heat;Gait training;Stair training;Functional mobility training;Therapeutic activities;Therapeutic exercise;Balance training;Neuromuscular re-education;Patient/family education;Manual techniques;Passive range of motion;Dry needling;Energy conservation;Taping     PT Next Visit Plan Assess response to strengthening exercise. Continue general strengthening and endurance. Consider elliptical. Progress planking and upper body exercises. Consider addition of stretching if sore.    PT Home Exercise Plan Access Code [facility]    Consulted and Agree with Plan of Care  Patient           Patient will benefit from skilled therapeutic intervention in order to improve the following deficits and impairments:  Difficulty walking, Decreased endurance, Decreased activity tolerance  Visit Diagnosis: Muscle weakness (generalized)  Difficulty in walking, not elsewhere classified  Other symptoms and signs involving the musculoskeletal system  History of COVID-19     Problem List Patient Active Problem List   Diagnosis Date Noted  . Intractable migraine without aura and without status migrainosus 03/10/2020  . Palpitations 02/01/2020  . Dyspnea on exertion 02/01/2020  . History of COVID-19 02/01/2020  . Other fatigue 02/01/2020  . Physical deconditioning 02/01/2020  . Adjustment insomnia 12/11/2019  . Other chest pain 11/26/2019  . Extreme exhaustion 11/24/2019  . Mixed hyperlipidemia 11/16/2019  . Migraine headache 10/02/2019  . 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 Obesity (BMI 30-39.9) 10/02/2019    Mccoy Testa [address] PT, DPT 03/21/2020, 2:03 PM  [facility] [address] [place], [place], [address] Phone: [phone]   Fax:  [phone]  Name: Taylor Ho MRN: [id number] Date of Birth: Feb 05, 1966

## 2020-03-26 ENCOUNTER — Telehealth: Payer: Self-pay | Admitting: *Deleted

## 2020-03-26 NOTE — Telephone Encounter (Signed)
Signed  Please let Ms. Rubano know that her CT scan may have very minimal scarring from covid, but she has some spots that we will watch on a 3 month follow up CT scan. Thanks! Electronically signed by Steffanie Dunn, DO at 03/15/2020 2:33 PM   Dossie Arbour, RN  03/20/2020 1:18 PM EDT     Left message to call office   Jake Bathe, MD  03/20/2020 8:58 AM EDT     Excellent CT scan of heart. No coronary artery disease.   There are a few pulmonary nodules that need to be followed. Please refer to Kandice Robinsons, NP in pulmonary clinic to continue monitoring.   Donato Schultz MD     Pt is followed by Dr Karie Fetch. Pt is scheduled for PFTs 7/21 and is due for f/u with Dr Chestine Spore in August.

## 2020-03-28 ENCOUNTER — Other Ambulatory Visit: Payer: Self-pay | Admitting: Family Medicine

## 2020-03-28 DIAGNOSIS — G43019 Migraine without aura, intractable, without status migrainosus: Secondary | ICD-10-CM

## 2020-03-29 ENCOUNTER — Ambulatory Visit: Admitting: Physical Therapy

## 2020-04-02 ENCOUNTER — Other Ambulatory Visit: Payer: Self-pay

## 2020-04-02 ENCOUNTER — Ambulatory Visit (INDEPENDENT_AMBULATORY_CARE_PROVIDER_SITE_OTHER): Payer: BC Managed Care – PPO | Admitting: Nurse Practitioner

## 2020-04-02 DIAGNOSIS — Z8616 Personal history of COVID-19: Secondary | ICD-10-CM | POA: Diagnosis not present

## 2020-04-02 NOTE — Patient Instructions (Signed)
History of Covid Pneumonia Acute Respiratory failure:   Follow up with pulmonary - upcoming PFT ordered  Continue albuterol as needed  Continue Advair  Continue to keep close check on O2 sats and use O2 as needed to keep sats above 90%  Deep breathing exercises  Stay active   Fatigue:  Continue PT  May start gentle flow yoga   Heart palpitations:  Please keep follow up with Cardiology  Headache:  May trial magnesium 600 mg daily  May trial riboflavin 400 mg daily  May trial omega 3 in addition to amitriptyline already ordered by PCP    Follow up in 3 months or sooner if needed    

## 2020-04-02 NOTE — Assessment & Plan Note (Signed)
History of Covid Pneumonia Acute Respiratory failure:   Follow up with pulmonary - upcoming PFT ordered  Continue albuterol as needed  Continue Advair  Continue to keep close check on O2 sats and use O2 as needed to keep sats above 90%  Deep breathing exercises  Stay active   Fatigue:  Continue PT  May start gentle flow yoga   Heart palpitations:  Please keep follow up with Cardiology  Headache:  May trial magnesium 600 mg daily  May trial riboflavin 400 mg daily  May trial omega 3 in addition to amitriptyline already ordered by PCP    Follow up in 3 months or sooner if needed

## 2020-04-02 NOTE — Progress Notes (Signed)
  ID: Taylor Ho, female    DOB: Oct 03, 1965, 54 y.o.   MRN: [id number]  Chief Complaint  Patient presents with  . Follow-up    history of covid    Referring provider: Blane Ohara, MD  54 year old female never smoker with history of migraines, hypothyroidism, obesity, hyperlipidemia. Diagnosed with covid December 2020.   Recent Significant Encounters:   Timeline of illness:  09/23/20 Covid Test: positive  10/02/19 - 10/07/19 Hospital admission: Admitted to the hospital for Covid pneumonia and acute respiratory failure. Patient was treated with remdesivir, Decadron, IV steroids. She was discharged home on oxygen at 2 L..   11/16/19 PCP follow-up: Patient complained of headache/migraine. Nadolol was increased to 120 mg daily continued on Imitrex and continued on O2 at 2 L.  11/24/2019 PCP follow-up: Patient was continued on O2 at 2 L nasal cannula  12/08/2019 PCP follow-up: Patient complaining of migraine and started on Aimovig 70 mg monthly. Patient also complained of insomnia started on amitriptyline 25 mg at night.  01/03/2020 PCP follow-up: Follow-up for headache and insomnia -noted the amitriptyline was helping.  01/31/2020 PCP follow-up: Patient continued of ongoing severe fatigue and shortness of breath -referred to post Covid clinic.  02/01/20 Post Covid Care Center Visit: ordered follow up chest x ray, referred to pulmonary, referred to cardiology, ordered PT  02/23/20 Cardiology: Ordered echo, ordered Zio patch, ordered cardiac CT  03/05/20 Pulmonary: Ordered HRCT, PFT, continued on O2 as needed, trial Advair, continued albuterol    Imaging:  10/05/19 chest x ray: Persistent patchy opacities in both lungs with mixed interval changes, favoring pneumonia, although a component of pulmonary edema is not excluded given the borderline cardiomegaly.  02/01/20 Chest X ray: Borderline cardiomegaly with improved bibasilar aeration. Residual mild atelectasis or developing  scar after COVID 19 pneumonia at both lung bases.  03/14/20 HRCT: There are findings in the lungs which could suggest very mild interstitial lung disease, with a spectrum of findings considered most compatible with an alternative diagnosis to usual interstitial pneumonia (UIP) per current ATS guidelines. If there is persistent clinical concern for interstitial lung disease, repeat high-resolution chest CT could be considered in 12 months to assess for temporal changes in the appearance of the lung parenchyma. Multiple pulmonary nodules scattered throughout the lungs bilaterally, largest of which is in the medial aspect of the right lower lobe measuring 1.1 x 0.9 x 0.8 cm. Non-contrast chest CT at 3-6 months is recommended. If the nodules are stable at time of repeat CT, then future CT at 18-24 months   03/19/20 Cardiac CT: Coronary calcium score of 0. This was 0 percentile for age and sex matched control. Normal coronary origin with right dominance. No evidence of CAD.  CAD-RADS=0.  HPI  Patient presents today for post Covid care clinic visit follow-up. She states that she is slowly improving. She is frustrated because her recovery has been so slow. Patient has followed up with pulmonary and cardiology since last visit. Pulmonary has ordered PFT to be done later this month. She also had an HRCT which came back abnormal. Cardiology ordered a ZIO Patch and a cardiac CT. Patient has not received results from [facility] patch. Cardiac CT was normal. Patient does still complain of shortness of breath with exertion and fatigue. She has started physical therapy. She also complains of ongoing headaches and does have a history of migraines. Patient does check her O2 sats at home and states that usually she does not need her oxygen  but with exertion at times her O2 does drop below 89% and she does rest and use her oxygen when this occurs. She has been started on Advair and states that this is helping some. She is trying to  stay active and does have a walking routine daily. Denies f/c/s, n/v/d, hemoptysis, PND, chest pain or edema.  Note: Patient was walked in office today and sats remained above 97% for entire walk on RA and heart rate was stable.        No Known Allergies  Immunization History  Administered Date(s) Administered  . Influenza-Unspecified 07/07/2019  . PFIZER SARS-COV-2 Vaccination 11/10/2019, 12/01/2019    Past Medical History:  Diagnosis Date  . Acute respiratory failure with hypoxia (HCC) 02/01/2020  . Acute respiratory failure with hypoxia (HCC) 02/01/2020  . Adjustment insomnia 12/11/2019  . AKI (acute kidney injury) (HCC) 10/02/2019  . COVID-19   . Diarrhea due to COVID-19 10/02/2019  . Dyspnea on exertion 02/01/2020  . Elevated serum creatinine 12/11/2019  . Extreme exhaustion 11/24/2019  . Fatigue 02/01/2020  . Headache   . Heart palpitations 02/01/2020  . History of COVID-19 02/01/2020  . Hyperlipidemia   . Hypothyroidism   . Migraine headache 10/02/2019  . Mixed hyperlipidemia 11/16/2019  . Morbid (severe) obesity due to excess calories (HCC)   . Obesity (BMI 30-39.9) 10/02/2019  . Other chest pain 11/26/2019  . Physical deconditioning 02/01/2020  . 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 Sore throat 12/11/2019  . Tension-type headache, not intractable 11/26/2019  . Tension-type headache, not intractable 11/26/2019    Tobacco History: Social History   Tobacco Use  Smoking Status Never Smoker  Smokeless Tobacco Never Used   Counseling given: Yes   Outpatient Encounter Medications as of 04/02/2020  Medication Sig  . albuterol (VENTOLIN HFA) 108 (90 Base) MCG/ACT inhaler Inhale 2 puffs into the lungs every 6 (six) hours as needed for wheezing or shortness of breath.  Marland Kitchen amitriptyline (ELAVIL) 25 MG tablet Take 1 tablet (25 mg total) by mouth at bedtime.  [name] (AIMOVIG) 140 MG/ML SOAJ Inject 140 mg into the skin every 30 (thirty) days.  Janann August 112 MCG tablet Take 1 tablet by  mouth once daily  . Fluticasone-Salmeterol (ADVAIR DISKUS) 250-50 MCG/DOSE AEPB Inhale 1 puff into the lungs 2 (two) times daily.  . meloxicam (MOBIC) 7.5 MG tablet Take 1 tablet (7.5 mg total) by mouth daily.  . metoprolol tartrate (LOPRESSOR) 100 MG tablet Take one tablet two hours prior to CT procedure.  . nadolol (CORGARD) 80 MG tablet Take 2 tablets by mouth once daily  . Rimegepant Sulfate (NURTEC) 75 MG TBDP Take at onset of migraine/aura. May NOT repeat dosing for 24 hrs.  . rosuvastatin (CRESTOR) 20 MG tablet Take 1 tablet (20 mg total) by mouth daily.  . SUMAtriptan (IMITREX) 100 MG tablet TAKE 1 TABLET BY MOUTH AT ONSET OF HEADACHE, MAY REPEAT IN 2 HOURS IF NEEDED  . Vitamin D, Ergocalciferol, (DRISDOL) 1.25 MG (50000 UNIT) CAPS capsule Take 1 capsule (50,000 Units total) by mouth every 7 (seven) days.   No facility-administered encounter medications on file as of 04/02/2020.     Review of Systems  Review of Systems  Constitutional: Positive for fatigue. Negative for fever.  HENT: Negative.   Respiratory: Positive for shortness of breath. Negative for cough.   Cardiovascular: Negative.  Negative for chest pain, palpitations and leg swelling.  Gastrointestinal: Negative.   Allergic/Immunologic: Negative.   Neurological: Negative.   Psychiatric/Behavioral: Negative.  Physical Exam  Pulse 80   Temp 97.7 F (36.5 C)   Resp 18   SpO2 98% Comment: RA  Wt Readings from Last 5 Encounters:  03/19/20 231 lb (104.8 kg)  03/05/20 231 lb (104.8 kg)  03/04/20 232 lb 3.2 oz (105.3 kg)  02/23/20 232 lb (105.2 kg)  02/01/20 232 lb 8 oz (105.5 kg)     Physical Exam Vitals and nursing note reviewed.  Constitutional:      General: She is not in acute distress.    Appearance: She is well-developed.  Cardiovascular:     Rate and Rhythm: Normal rate and regular rhythm.  Pulmonary:     Effort: Pulmonary effort is normal.     Comments: Diminished to bilateral  bases Musculoskeletal:     Right lower leg: No edema.     Left lower leg: No edema.  Neurological:     Mental Status: She is alert and oriented to person, place, and time.  Psychiatric:        Mood and Affect: Mood normal.        Behavior: Behavior normal.       Imaging: CT Chest High Resolution  Result Date: 03/15/2020 CLINICAL DATA:  54 year old female with history of dyspnea on exertion. Prior history of COVID-19 pneumonia in December 2020. EXAM: CT CHEST WITHOUT CONTRAST TECHNIQUE: Multidetector CT imaging of the chest was performed following the standard protocol without intravenous contrast. High resolution imaging of the lungs, as well as inspiratory and expiratory imaging, was performed. COMPARISON:  No priors. FINDINGS: Cardiovascular: Heart size is normal. There is no significant pericardial fluid, thickening or pericardial calcification. No atherosclerotic calcifications in the thoracic aorta or the coronary arteries. Mediastinum/Nodes: No pathologically enlarged mediastinal or hilar lymph nodes. Please note that accurate exclusion of hilar adenopathy is limited on noncontrast CT scans. Esophagus is unremarkable in appearance. No axillary lymphadenopathy. Lungs/Pleura: High-resolution images demonstrate widespread but very mild ground-glass attenuation and some patchy mild septal thickening throughout the lungs bilaterally. There is no discernible craniocaudal gradient. No traction bronchiectasis or honeycombing. Inspiratory and expiratory imaging is unremarkable. Multiple pulmonary nodules are scattered throughout the lungs bilaterally, most of which are 5 mm or less in size. The largest of these nodules is in the medial aspect of the right lower lobe (axial image 64 of series 3 and coronal image 77 of series 6) measuring 1.1 x 0.9 x 0.8 cm. No confluent consolidative airspace disease. No pleural effusions. Upper Abdomen: Status post cholecystectomy. Musculoskeletal: There are no  aggressive appearing lytic or blastic lesions noted in the visualized portions of the skeleton. IMPRESSION: 1. There are findings in the lungs which could suggest very mild interstitial lung disease, with a spectrum of findings considered most compatible with an alternative diagnosis to usual interstitial pneumonia (UIP) per current ATS guidelines. If there is persistent clinical concern for interstitial lung disease, repeat high-resolution chest CT could be considered in 12 months to assess for temporal changes in the appearance of the lung parenchyma. 2. Multiple pulmonary nodules scattered throughout the lungs bilaterally, largest of which is in the medial aspect of the right lower lobe measuring 1.1 x 0.9 x 0.8 cm. Non-contrast chest CT at 3-6 months is recommended. If the nodules are stable at time of repeat CT, then future CT at 18-24 months (from today's scan) is considered optional for low-risk patients, but is recommended for high-risk patients. This recommendation follows the consensus statement: Guidelines for Management of Incidental Pulmonary Nodules Detected on CT Images:  From the Fleischner Society 2017; Radiology 2017; [phone]. Electronically Signed   By: Trudie Reed M.D.   On: 03/15/2020 12:21   CT CORONARY MORPH W/CTA COR W/SCORE W/CA W/CM &/OR WO/CM  Addendum Date: 03/20/2020   ADDENDUM REPORT: 03/20/2020 08:45 EXAM: Cardiac/Coronary  CT TECHNIQUE: The patient was scanned on a [employer]. FINDINGS: A 120 kV prospective scan was triggered in the descending thoracic aorta at 111 HU's. Axial non-contrast 3 mm slices were carried out through the heart. The data set was analyzed on a dedicated work station and scored using the Agatson method. Gantry rotation speed was 250 msecs and collimation was .6 mm. No beta blockade and 0.8 mg of sl NTG was given. The 3D data set was reconstructed in 5% intervals of the 67-82 % of the R-R cycle. Diastolic phases were analyzed on a  dedicated work station using MPR, MIP and VRT modes. The patient received 80 cc of contrast. Aorta:  Normal size.  No calcifications.  No dissection. Aortic Valve:  Trileaflet.  No calcifications. Coronary Arteries:  Normal coronary origin.  Right dominance. RCA is a large dominant artery that gives rise to PDA and PLVB. There is no plaque. Left main is a large artery that gives rise to LAD and LCX arteries. There is no plaque. LAD is a large vessel that gives rise to 2 small diagonal branches. There is no plaque. LCX is a non-dominant artery that gives rise to one large OM1 branch and a small OM2 branch. There is no plaque. Other findings: Normal pulmonary vein drainage into the left atrium. Normal let atrial appendage without a thrombus. Normal size of the pulmonary artery. IMPRESSION: 1. Coronary calcium score of 0. This was 0 percentile for age and sex matched control. 2. Normal coronary origin with right dominance. 3. No evidence of CAD.  CAD-RADS=0. 4. Consider non-atherosclerotic causes of chest pain. Armanda Magic Electronically Signed   By: Armanda Magic   On: 03/20/2020 08:45   Result Date: 03/20/2020 EXAM: OVER-READ INTERPRETATION  CT CHEST The following report is an over-read performed by radiologist Dr. Trudie Reed of [facility] on 03/19/2020. This over-read does not include interpretation of cardiac or coronary anatomy or pathology. The coronary calcium score/coronary CTA interpretation by the cardiologist is attached. COMPARISON:  None. FINDINGS: Multiple small pulmonary nodules are noted in the visualized portions of the right lung, largest of which is in the medial aspect of the right lower lobe measuring 8 mm. Within the visualized portions of the thorax there is no acute consolidative airspace disease, no pleural effusions, no pneumothorax and no lymphadenopathy. Visualized portions of the upper abdomen are unremarkable. There are no aggressive appearing lytic or blastic lesions  noted in the visualized portions of the skeleton. IMPRESSION: 1. Multiple small pulmonary nodules in the right lung, similar to the recent prior examination, measuring up to 9 x 8 mm in the right lower lobe. Non-contrast chest CT at 3-6 months is recommended. If the nodules are stable at time of repeat CT, then future CT at 18-24 months (from today's scan) is considered optional for low-risk patients, but is recommended for high-risk patients. This recommendation follows the consensus statement: Guidelines for Management of Incidental Pulmonary Nodules Detected on CT Images: From the Fleischner Society 2017; Radiology 2017; 284:228-243. Electronically Signed: By: Trudie Reed M.D. On: 03/19/2020 16:04   ECHOCARDIOGRAM COMPLETE  Result Date: 03/14/2020    ECHOCARDIOGRAM REPORT   Patient Name:   FLORENA KOZMA Date of  Exam: 03/14/2020 Medical Rec #:  [id number]     Height:       66.0 in Accession #:    [id number]    Weight:       231.0 lb Date of Birth:  07-25-1966     BSA:          2.126 m Patient Age:    53 years      BP:           124/80 mmHg Patient Gender: F             HR:           55 bpm. Exam Location:  [address] Procedure: 2D Echo, 3D Echo, Cardiac Doppler, Color Doppler and Strain Analysis Indications:    R06.02 Shortness of Breath  History:        Patient has no prior history of Echocardiogram examinations.                 Signs/Symptoms:Shortness of Breath and Chest Pain; Risk                 Factors:Dyslipidemia. COVID 19 Virus (December 2020-January                 2021) with Residual Palpitations, SOB and chest Pain. Obesity,                 Acute Respiratory Failure (March 2021).  Sonographer:    Farrel Conners RDCS Referring Phys: [id number] MARK C SKAINS  Sonographer Comments: Technically difficult study due to poor echo windows. IMPRESSIONS  1. Left ventricular ejection fraction, by estimation, is 55 to 60%. The left ventricle has normal function. The left ventricle has no regional wall motion  abnormalities. Left ventricular diastolic parameters were normal.  2. Right ventricular systolic function is normal. The right ventricular size is normal. Tricuspid regurgitation signal is inadequate for assessing PA pressure.  3. The mitral valve is grossly normal. No evidence of mitral valve regurgitation. No evidence of mitral stenosis.  4. The aortic valve is tricuspid. Aortic valve regurgitation is not visualized. No aortic stenosis is present.  5. The inferior vena cava is normal in size with greater than 50% respiratory variability, suggesting right atrial pressure of 3 mmHg. Conclusion(s)/Recommendation(s): Normal biventricular function without evidence of hemodynamically significant valvular heart disease. FINDINGS  Left Ventricle: Left ventricular ejection fraction, by estimation, is 55 to 60%. The left ventricle has normal function. The left ventricle has no regional wall motion abnormalities. Global longitudinal strain performed but not reported based on interpreter judgement due to suboptimal tracking. The left ventricular internal cavity size was normal in size. There is no left ventricular hypertrophy. Left ventricular diastolic parameters were normal. Right Ventricle: The right ventricular size is normal. No increase in right ventricular wall thickness. Right ventricular systolic function is normal. Tricuspid regurgitation signal is inadequate for assessing PA pressure. Left Atrium: Left atrial size was normal in size. Right Atrium: Right atrial size was normal in size. Pericardium: Trivial pericardial effusion is present. Mitral Valve: The mitral valve is grossly normal. No evidence of mitral valve regurgitation. No evidence of mitral valve stenosis. Tricuspid Valve: The tricuspid valve is grossly normal. Tricuspid valve regurgitation is not demonstrated. No evidence of tricuspid stenosis. Aortic Valve: The aortic valve is tricuspid. Aortic valve regurgitation is not visualized. No aortic stenosis  is present. Pulmonic Valve: The pulmonic valve was grossly normal. Pulmonic valve regurgitation is trivial. No evidence of pulmonic stenosis. Aorta: The aortic root  and ascending aorta are structurally normal, with no evidence of dilitation. Venous: The inferior vena cava is normal in size with greater than 50% respiratory variability, suggesting right atrial pressure of 3 mmHg. IAS/Shunts: The atrial septum is grossly normal.  LEFT VENTRICLE PLAX 2D LVIDd:         3.61 cm  Diastology LVIDs:         3.03 cm  LV e' lateral:   7.29 cm/s LV PW:         1.08 cm  LV E/e' lateral: 6.0 LV IVS:        0.98 cm  LV e' medial:    5.77 cm/s LVOT diam:     2.70 cm  LV E/e' medial:  7.6 LV SV:         100 LV SV Index:   47 LVOT Area:     5.73 cm                          3D Volume EF:                         3D EF:        56 %                         LV EDV:       91 ml                         LV ESV:       40 ml                         LV SV:        51 ml RIGHT VENTRICLE RV S prime:     9.90 cm/s TAPSE (M-mode): 1.2 cm LEFT ATRIUM             Index       RIGHT ATRIUM           Index LA diam:        4.20 cm 1.98 cm/m  RA Area:     11.10 cm LA Vol (A2C):   44.9 ml 21.12 ml/m RA Volume:   25.50 ml  11.99 ml/m LA Vol (A4C):   35.7 ml 16.79 ml/m LA Biplane Vol: 41.8 ml 19.66 ml/m  AORTIC VALVE LVOT Vmax:   86.50 cm/s LVOT Vmean:  53.000 cm/s LVOT VTI:    0.174 m  AORTA Ao Root diam: 3.70 cm Ao Asc diam:  3.30 cm MITRAL VALVE MV Area (PHT): cm         SHUNTS MV Decel Time: 239 msec    Systemic VTI:  0.17 m MV E velocity: 44.10 cm/s  Systemic Diam: 2.70 cm MV A velocity: 58.70 cm/s MV E/A ratio:  0.75 Lennie OdorWesley O'Neal MD Electronically signed by Lennie OdorWesley O'Neal MD Signature Date/Time: 03/14/2020/8:58:13 PM    Final      Assessment & Plan:   History of COVID-19 History of Covid Pneumonia Acute Respiratory failure:   Follow up with pulmonary - upcoming PFT ordered  Continue albuterol as needed  Continue  Advair  Continue to keep close check on O2 sats and use O2 as needed to keep sats above 90%  Deep breathing exercises  Stay active   Fatigue:  Continue PT  May start gentle flow yoga  Heart palpitations:  Please keep follow up with Cardiology  Headache:  May trial magnesium 600 mg daily  May trial riboflavin 400 mg daily  May trial omega 3 in addition to amitriptyline already ordered by PCP    Follow up in 3 months or sooner if needed        Taylor Andrew, NP 04/02/2020

## 2020-04-03 ENCOUNTER — Other Ambulatory Visit: Payer: Self-pay

## 2020-04-03 ENCOUNTER — Other Ambulatory Visit: Payer: Self-pay | Admitting: Family Medicine

## 2020-04-03 ENCOUNTER — Ambulatory Visit: Attending: Nurse Practitioner | Admitting: Physical Therapy

## 2020-04-03 DIAGNOSIS — R29898 Other symptoms and signs involving the musculoskeletal system: Secondary | ICD-10-CM | POA: Diagnosis not present

## 2020-04-03 DIAGNOSIS — Z8616 Personal history of COVID-19: Secondary | ICD-10-CM

## 2020-04-03 DIAGNOSIS — R262 Difficulty in walking, not elsewhere classified: Secondary | ICD-10-CM | POA: Diagnosis not present

## 2020-04-03 DIAGNOSIS — M6281 Muscle weakness (generalized): Secondary | ICD-10-CM | POA: Diagnosis not present

## 2020-04-03 NOTE — Therapy (Signed)
[facility] [address] [place], [place], [address] Phone: [phone]   Fax:  [phone]  Physical Therapy Treatment  Patient Details  Name: Taylor Ho MRN: [id number] Date of Birth: July 21, 1966 Referring Provider (PT): Ivonne Andrew, NP   Encounter Date: 04/03/2020   PT End of Session - 04/03/20 1528    Visit Number 3    Number of Visits 6    Date for PT Re-Evaluation 04/26/20    Authorization Type BCBS    PT Start Time 1533    PT Stop Time 1620    PT Time Calculation (min) 47 min    Activity Tolerance Patient tolerated treatment well    Behavior During Therapy [facility] for tasks assessed/performed           Past Medical History:  Diagnosis Date  . Acute respiratory failure with hypoxia (HCC) 02/01/2020  . Acute respiratory failure with hypoxia (HCC) 02/01/2020  . Adjustment insomnia 12/11/2019  . AKI (acute kidney injury) (HCC) 10/02/2019  . COVID-19   . Diarrhea due to COVID-19 10/02/2019  . Dyspnea on exertion 02/01/2020  . Elevated serum creatinine 12/11/2019  . Extreme exhaustion 11/24/2019  . Fatigue 02/01/2020  . Headache   . Heart palpitations 02/01/2020  . History of COVID-19 02/01/2020  . Hyperlipidemia   . Hypothyroidism   . Migraine headache 10/02/2019  . Mixed hyperlipidemia 11/16/2019  . Morbid (severe) obesity due to excess calories (HCC)   . Obesity (BMI 30-39.9) 10/02/2019  . Other chest pain 11/26/2019  . Physical deconditioning 02/01/2020  . 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 Sore throat 12/11/2019  . Tension-type headache, not intractable 11/26/2019  . Tension-type headache, not intractable 11/26/2019    Past Surgical History:  Procedure Laterality Date  . CHOLECYSTECTOMY    . KNEE ARTHROSCOPY Left 08/13/2017   Procedure: ARTHROSCOPY KNEE;  Surgeon: Jodi Geralds, MD;  Location: [facility] OR;  Service: Orthopedics;  Laterality: Left;  . TUBAL LIGATION      There were no vitals filed for this visit.   Subjective  Assessment - 04/03/20 1533    Subjective Pt reports that some days she feels she can do more. Pt states on the days that she does do a lot more she feels she needs 2-3 more days to recover. Pt states that she feels within the past week that she has been more fatigued.    Pertinent History COVID    Limitations Walking;House hold activities    Diagnostic tests CT and XR    Patient Stated Goals Improve endurance and activity tolerance for daily tasks and return to work    Currently in Pain? No/denies                             [facility] Adult PT Treatment/Exercise - 04/03/20 0001      Knee/Hip Exercises: Aerobic   Elliptical x 6 min      Knee/Hip Exercises: Standing   SLS with Vectors 3 x 10 sec each leg    Other Standing Knee Exercises Forward T 3 x 10 sec bilat    Other Standing Knee Exercises vestibular ball 2 x 10 CW and CCW      Shoulder Exercises: Seated   Other Seated Exercises bicep curl 2x10 blue tband      Shoulder Exercises: Standing   Diagonals Strengthening;Both;10 reps;Theraband    Theraband Level (Shoulder Diagonals) Level 4 (Blue)    Other Standing Exercises tricep  2x10 blue tband      Shoulder Exercises: ROM/Strengthening   Plank 30 seconds   on knees,    Side Plank Other (comment)   15 sec bilat                   PT Short Term Goals - 03/15/20 1210      PT SHORT TERM GOAL #1   Title Pt will have RPE of </= 3/10 after performing 10 squats with 10# weight    Baseline RPE of 5/10 after 10 squats with 10# weight    Time 3    Period Weeks    Status New    Target Date 04/05/20             PT Long Term Goals - 03/15/20 1217      PT LONG TERM GOAL #1   Title Pt will be independent with HEP    Baseline New    Time 6    Period Weeks    Status New    Target Date 04/26/20      PT LONG TERM GOAL #2   Title Pt will be able to walk 6 minutes on treadmill with 5% elevation at 1.3 mph with RPE </= 2/10    Baseline RPE 5/10    Time 6     Period Weeks    Status New    Target Date 04/26/20      PT LONG TERM GOAL #3   Title Pt will be able to tolerate ambulating 1 mile with RPE <2/10    Baseline Unable    Time 6    Period Weeks    Status New    Target Date 04/26/20      PT LONG TERM GOAL #4   Title Pt will have improved FOTO score to </= 40% impairment    Baseline 54% impairment    Time 6    Period Weeks    Status New    Target Date 04/26/20                 Plan - 04/03/20 1624    Clinical Impression Statement Treatment focused on progressing UE strengthening. Pt reports decreased spO2 when combining upper and lower body movements (i.e. in shower); Initiated balance/proprioception exercises. SpO2 93-96% throughout treatment.    Personal Factors and Comorbidities Fitness;Comorbidity 1;Time since onset of injury/illness/exacerbation    Comorbidities COVID    Examination-Activity Limitations Caring for Others;Hygiene/Grooming;Lift;Stairs;Locomotion Level    Examination-Participation Restrictions Psychiatric nurse;Shop;Yard Work    Stability/Clinical Decision Making Stable/Uncomplicated    Rehab Potential Good    PT Frequency 1x / week    PT Duration 6 weeks    PT Treatment/Interventions ADLs/Self Care Home Management;Cryotherapy;Electrical Stimulation;Iontophoresis 4mg /ml Dexamethasone;Moist Heat;Gait training;Stair training;Functional mobility training;Therapeutic activities;Therapeutic exercise;Balance training;Neuromuscular re-education;Patient/family education;Manual techniques;Passive range of motion;Dry needling;Energy conservation;Taping    PT Next Visit Plan Continue whole body strengthening and endurance. Progress planking and upper body exercises. Consider addition of stretching if sore, add increased proprioception/balance. Add exercises that have combined upper body and lower body movements.    PT Home Exercise Plan Access Code [facility]    Consulted and Agree with Plan of Care  Patient           Patient will benefit from skilled therapeutic intervention in order to improve the following deficits and impairments:  Difficulty walking, Decreased endurance, Decreased activity tolerance  Visit Diagnosis: Muscle weakness (generalized)  Difficulty in walking, not elsewhere classified  Other symptoms and  signs involving the musculoskeletal system  History of COVID-19     Problem List Patient Active Problem List   Diagnosis Date Noted  . Intractable migraine without aura and without status migrainosus 03/10/2020  . Palpitations 02/01/2020  . Dyspnea on exertion 02/01/2020  . History of COVID-19 02/01/2020  . Other fatigue 02/01/2020  . Physical deconditioning 02/01/2020  . Adjustment insomnia 12/11/2019  . Other chest pain 11/26/2019  . Extreme exhaustion 11/24/2019  . Mixed hyperlipidemia 11/16/2019  . Migraine headache 10/02/2019  . 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 Obesity (BMI 30-39.9) 10/02/2019    Alis Sawchuk April Ma L Josselyn Harkins PT, DPT 04/03/2020, 4:28 PM  [facility] [address] [place], [place], [address] Phone: [phone]   Fax:  [phone]  Name: MAKENNAH OMURA MRN: [id number] Date of Birth: 1965-11-04

## 2020-04-05 DIAGNOSIS — U071 COVID-19: Secondary | ICD-10-CM | POA: Diagnosis not present

## 2020-04-08 ENCOUNTER — Other Ambulatory Visit: Payer: Self-pay

## 2020-04-08 ENCOUNTER — Ambulatory Visit: Admitting: Physical Therapy

## 2020-04-08 DIAGNOSIS — Z8616 Personal history of COVID-19: Secondary | ICD-10-CM

## 2020-04-08 DIAGNOSIS — R29898 Other symptoms and signs involving the musculoskeletal system: Secondary | ICD-10-CM

## 2020-04-08 DIAGNOSIS — M6281 Muscle weakness (generalized): Secondary | ICD-10-CM

## 2020-04-08 DIAGNOSIS — R262 Difficulty in walking, not elsewhere classified: Secondary | ICD-10-CM | POA: Diagnosis not present

## 2020-04-08 NOTE — Therapy (Signed)
[facility] [address] [place], [place], [address] Phone: [phone]   Fax:  [phone]  Physical Therapy Treatment  Patient Details  Name: KAMY POINSETT MRN: [id number] Date of Birth: [birth year] Referring Provider (PT): Ivonne Andrew, NP   Encounter Date: 04/08/2020   PT End of Session - 04/08/20 1534    Visit Number 4    Number of Visits 6    Date for PT Re-Evaluation 04/26/20    Authorization Type BCBS    PT Start Time 1535    PT Stop Time 1615    PT Time Calculation (min) 40 min    Activity Tolerance Patient tolerated treatment well    Behavior During Therapy [facility] for tasks assessed/performed           Past Medical History:  Diagnosis Date  . Acute respiratory failure with hypoxia (HCC) 02/01/2020  . Acute respiratory failure with hypoxia (HCC) 02/01/2020  . Adjustment insomnia 12/11/2019  . AKI (acute kidney injury) (HCC) 10/02/2019  . COVID-19   . Diarrhea due to COVID-19 10/02/2019  . Dyspnea on exertion 02/01/2020  . Elevated serum creatinine 12/11/2019  . Extreme exhaustion 11/24/2019  . Fatigue 02/01/2020  . Headache   . Heart palpitations 02/01/2020  . History of COVID-19 02/01/2020  . Hyperlipidemia   . Hypothyroidism   . Migraine headache 10/02/2019  . Mixed hyperlipidemia 11/16/2019  . Morbid (severe) obesity due to excess calories (HCC)   . Obesity (BMI 30-39.9) 10/02/2019  . Other chest pain 11/26/2019  . Physical deconditioning 02/01/2020  . 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 Sore throat 12/11/2019  . Tension-type headache, not intractable 11/26/2019  . Tension-type headache, not intractable 11/26/2019    Past Surgical History:  Procedure Laterality Date  . CHOLECYSTECTOMY    . KNEE ARTHROSCOPY Left 08/13/2017   Procedure: ARTHROSCOPY KNEE;  Surgeon: Jodi Geralds, MD;  Location: [facility] OR;  Service: Orthopedics;  Laterality: Left;  . TUBAL LIGATION      There were no vitals filed for this visit.   Subjective  Assessment - 04/08/20 1538    Subjective Pt reports she's been breaking up the exercises. She states they are still a challenge. Pt reports continued fatigue.    Pertinent History COVID    Limitations Walking;House hold activities    Diagnostic tests CT and XR    Patient Stated Goals Improve endurance and activity tolerance for daily tasks and return to work    Currently in Pain? No/denies                             [facility] Adult PT Treatment/Exercise - 04/08/20 0001      Knee/Hip Exercises: Aerobic   Tread Mill 5% grade, 0.6 to 1.1 mph x 6 min   RPE 5 or 6/10     Knee/Hip Exercises: Standing   Forward Lunges Both;10 reps   with overhead press; RPE 7/10   Side Lunges Both;10 reps    Lateral Step Up Both;10 reps    Forward Step Up 10 reps;Both   with alternating UE   SLS single leg cone touch 10 reps    Other Standing Knee Exercises On airex: feet together eyes closed x 30 sec, with head turns horizontal & vertical x 10 reps, SLS 3 x 15 sec                    PT Short Term Goals - 03/15/20  1210      PT SHORT TERM GOAL #1   Title Pt will have RPE of </= 3/10 after performing 10 squats with 10# weight    Baseline RPE of 5/10 after 10 squats with 10# weight    Time 3    Period Weeks    Status New    Target Date 04/05/20             PT Long Term Goals - 03/15/20 1217      PT LONG TERM GOAL #1   Title Pt will be independent with HEP    Baseline New    Time 6    Period Weeks    Status New    Target Date 04/26/20      PT LONG TERM GOAL #2   Title Pt will be able to walk 6 minutes on treadmill with 5% elevation at 1.3 mph with RPE </= 2/10    Baseline RPE 5/10    Time 6    Period Weeks    Status New    Target Date 04/26/20      PT LONG TERM GOAL #3   Title Pt will be able to tolerate ambulating 1 mile with RPE <2/10    Baseline Unable    Time 6    Period Weeks    Status New    Target Date 04/26/20      PT LONG TERM GOAL #4   Title  Pt will have improved FOTO score to </= 40% impairment    Baseline 54% impairment    Time 6    Period Weeks    Status New    Target Date 04/26/20                 Plan - 04/08/20 1616    Clinical Impression Statement Treatment focused on multijoint exercises including bilat UE and LE -- lunges are challenging for pt. SpO2 92-95% throughout treatment. Progressed balance and proprioception exercises. Pt remains independent with her walking program at home.    Personal Factors and Comorbidities Fitness;Comorbidity 1;Time since onset of injury/illness/exacerbation    Comorbidities COVID    Examination-Activity Limitations Caring for Others;Hygiene/Grooming;Lift;Stairs;Locomotion Level    Examination-Participation Restrictions Psychiatric nurse;Shop;Yard Work    Stability/Clinical Decision Making Stable/Uncomplicated    Rehab Potential Good    PT Frequency 1x / week    PT Duration 6 weeks    PT Treatment/Interventions ADLs/Self Care Home Management;Cryotherapy;Electrical Stimulation;Iontophoresis 4mg /ml Dexamethasone;Moist Heat;Gait training;Stair training;Functional mobility training;Therapeutic activities;Therapeutic exercise;Balance training;Neuromuscular re-education;Patient/family education;Manual techniques;Passive range of motion;Dry needling;Energy conservation;Taping    PT Next Visit Plan Continue whole body strengthening and endurance. Progress planking and proprioception/balance.    PT Home Exercise Plan Access Code [facility]    Consulted and Agree with Plan of Care Patient           Patient will benefit from skilled therapeutic intervention in order to improve the following deficits and impairments:  Difficulty walking, Decreased endurance, Decreased activity tolerance  Visit Diagnosis: Muscle weakness (generalized)  Difficulty in walking, not elsewhere classified  Other symptoms and signs involving the musculoskeletal system  History of  COVID-19     Problem List Patient Active Problem List   Diagnosis Date Noted  . Intractable migraine without aura and without status migrainosus 03/10/2020  . Palpitations 02/01/2020  . Dyspnea on exertion 02/01/2020  . History of COVID-19 02/01/2020  . Other fatigue 02/01/2020  . Physical deconditioning 02/01/2020  . Adjustment insomnia 12/11/2019  . Other chest pain  11/26/2019  . Extreme exhaustion 11/24/2019  . Mixed hyperlipidemia 11/16/2019  . Migraine headache 10/02/2019  . 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 Obesity (BMI 30-39.9) 10/02/2019    Marlyn Rabine April Ma L Maddox Hlavaty PT, DPT 04/08/2020, 4:24 PM  [facility] [address] [place], [place], [address] Phone: [phone]   Fax:  [phone]  Name: NIANNA IGO MRN: [id number] Date of Birth: 09/16/1966

## 2020-04-11 ENCOUNTER — Other Ambulatory Visit: Payer: Self-pay | Admitting: Family Medicine

## 2020-04-17 ENCOUNTER — Ambulatory Visit (INDEPENDENT_AMBULATORY_CARE_PROVIDER_SITE_OTHER): Payer: BC Managed Care – PPO | Admitting: Critical Care Medicine

## 2020-04-17 ENCOUNTER — Other Ambulatory Visit: Payer: Self-pay

## 2020-04-17 DIAGNOSIS — R0600 Dyspnea, unspecified: Secondary | ICD-10-CM

## 2020-04-17 DIAGNOSIS — Z8616 Personal history of COVID-19: Secondary | ICD-10-CM

## 2020-04-17 DIAGNOSIS — R0609 Other forms of dyspnea: Secondary | ICD-10-CM

## 2020-04-17 LAB — PULMONARY FUNCTION TEST
DL/VA % pred: 116 %
DL/VA: 4.97 ml/min/mmHg/L
DLCO cor % pred: 87 %
DLCO cor: 18.15 ml/min/mmHg
DLCO unc % pred: 87 %
DLCO unc: 18.15 ml/min/mmHg
FEF 25-75 Post: 2.8 L/sec
FEF 25-75 Pre: 2.4 L/sec
FEF2575-%Change-Post: 16 %
FEF2575-%Pred-Post: 107 %
FEF2575-%Pred-Pre: 92 %
FEV1-%Change-Post: 1 %
FEV1-%Pred-Post: 83 %
FEV1-%Pred-Pre: 82 %
FEV1-Post: 2.26 L
FEV1-Pre: 2.23 L
FEV1FVC-%Change-Post: 3 %
FEV1FVC-%Pred-Pre: 104 %
FEV6-%Change-Post: -1 %
FEV6-%Pred-Post: 78 %
FEV6-%Pred-Pre: 79 %
FEV6-Post: 2.64 L
FEV6-Pre: 2.68 L
FEV6FVC-%Change-Post: 0 %
FEV6FVC-%Pred-Post: 103 %
FEV6FVC-%Pred-Pre: 102 %
FVC-%Change-Post: -2 %
FVC-%Pred-Post: 76 %
FVC-%Pred-Pre: 78 %
FVC-Post: 2.64 L
FVC-Pre: 2.7 L
Post FEV1/FVC ratio: 86 %
Post FEV6/FVC ratio: 100 %
Pre FEV1/FVC ratio: 83 %
Pre FEV6/FVC Ratio: 99 %
RV % pred: 82 %
RV: 1.54 L
TLC % pred: 83 %
TLC: 4.21 L

## 2020-04-17 NOTE — Progress Notes (Signed)
PFT done today. 

## 2020-04-22 ENCOUNTER — Ambulatory Visit: Admitting: Physical Therapy

## 2020-04-23 ENCOUNTER — Ambulatory Visit (INDEPENDENT_AMBULATORY_CARE_PROVIDER_SITE_OTHER): Payer: BC Managed Care – PPO | Admitting: Pulmonary Disease

## 2020-04-23 ENCOUNTER — Other Ambulatory Visit: Payer: Self-pay

## 2020-04-23 ENCOUNTER — Encounter: Payer: Self-pay | Admitting: Pulmonary Disease

## 2020-04-23 VITALS — BP 124/80 | HR 68 | Temp 97.5°F | Ht 66.0 in | Wt 234.4 lb

## 2020-04-23 DIAGNOSIS — R918 Other nonspecific abnormal finding of lung field: Secondary | ICD-10-CM

## 2020-04-23 DIAGNOSIS — Z8616 Personal history of COVID-19: Secondary | ICD-10-CM

## 2020-04-23 DIAGNOSIS — R0609 Other forms of dyspnea: Secondary | ICD-10-CM | POA: Insufficient documentation

## 2020-04-23 DIAGNOSIS — R5381 Other malaise: Secondary | ICD-10-CM

## 2020-04-23 DIAGNOSIS — R0600 Dyspnea, unspecified: Secondary | ICD-10-CM | POA: Diagnosis not present

## 2020-04-23 NOTE — Assessment & Plan Note (Signed)
Reviewed high-resolution CT chest with patient Reviewed pulmonary function testing with patient Patient feels clinical benefit since starting ICS/LABA-Advair 250  Plan: Continue Advair 250 Continue to work with outpatient physical therapy Could consider pulmonary rehab referral in the future We will tentatively plan on repeating a CT of her chest in 3 months Could also consider repeating high-resolution CT chest in 1 year if we are continuing to have persistent symptoms to see if groundglass opacities and mild inflammation seen on recent high-resolution CT chest has progressed Work on increasing overall physical activity at this time

## 2020-04-23 NOTE — Progress Notes (Signed)
@Patient  ID: , female    DOB: March 05, 1966, 54 y.o.   MRN: [id number]  Chief Complaint  Patient presents with  . Follow-up    Reviewed pulmonary function test, status post COVID-19, review high-resolution CT chest    Referring provider: [id number], MD  HPI:  54 year old female never smoker followed in our office for post Covid follow-up after contracting COVID-19 in December/2020 requiring hospitalization in January/2021.  She received remdesivir, Decadron as well as IV steroids.  PMH: Hypothyroidism, obesity, palpitations, physical deconditioning Smoker/ Smoking History: Never smoker Maintenance: Advair 250 Diskus Pt of: Dr. February/2021  04/23/2020  - Visit   54 year old female never smoker initially consulted with our practice in June/2021 establishing care with Dr. July/2021.  Patient contracted COVID-19 in December/2020.  She was hospitalized for hypoxic respiratory failure due to the pneumonia from 1/4/1/9, received remdesivir, Decadron, IV steroids.  She was discharged on 2 L of O2.  Patient continues to have ongoing cough as well as dyspnea on exertion.  Patient continues to have weakness.  She has been evaluated by cardiology in May/2021 Dr. Jun/2021 which showed documentation of palpitations as well as racing heart rate, and echocardiogram, CTA chest as well as Zio patch were ordered which patient was currently wearing at time of visit in June/2021.  Has no family history of lung disease and is a never smoker and has received her Covid vaccines now.  It was recommended at that visit for the patient to obtain a high-resolution CT chest, pulmonary function testing start Advair twice daily, start albuterol every 4 hours as needed, continue supplemental oxygen as needed, and to increase physical activity to regain exercise tolerance.  Patient presenting to office today reporting continued follow-up for long Covid symptoms status post December/2020 COVID-19 infection and January/2021  COVID-19 pneumonia requiring hospitalization.  Patient feels that since last being seen starting Advair 250 Diskus has helped clinically.  Patient still using her rescue inhaler occasionally.  She reports over the last week she is maybe used it 3-5 times.  She continues to work with outpatient physical therapy to increase her overall physical activity.  She is completed a high-resolution CT chest that showed potentially some mild interstitial lung disease and some groundglass opacities.  Also showed a few pulmonary nodules.  Radiology recommending a follow-up CT chest in 3 to 6 months to follow the pulmonary nodules.  Clinically patient can be evaluated and we could consider repeating a high-resolution CT chest in 1 year if we have a strong suspicion of interstitial lung disease.  Pulmonary function testing completed today does not show any restriction or diffusion defect or obstructive lung disease.  Patient has had follow-up with cardiology Dr. February/2021.  Zio patch showed occasional PACs and PVCs.  No new recommendations mention.  Patient's most recent echocardiogram was stable and felt to be reassuring by cardiology.  Questionaires / Pulmonary Flowsheets:   ACT:  No flowsheet data found.  MMRC: No flowsheet data found.  Epworth:  No flowsheet data found.  Tests:   02/01/2020-chest x-ray-borderline cardiomegaly with improved bibasilar aeration, residual mild atelectasis or developing scar after COVID-19 pneumonia both lung bases  03/15/2020-CT chest high-res-there are findings in the lungs which could suggest very mild interstitial lung disease with a spectrum of findings considered most compatible with alternative diagnosis to UIP per ATS guidelines, could consider repeating high-resolution CT chest in 12 months to further assess for temporal changes if there remains a clinical concern for interstitial lung disease, multiple pulmonary  nodules scattered throughout the lungs bilaterally largest of  which is in the medial aspect of the right lower lobe measuring 1.1 x 0.9 x 0.8 cm, noncontrast chest CT at 3 to 6 months is recommended if the nodules are stable that time repeat CT chest at 18 to 24 months  03/14/2020-echocardiogram-LV ejection fraction 55 to 60%, right ventricular systolic function is normal, tricuspid regurgitation signal was inadequate for assessing PA pressure  04/17/2020-pulmonary function test-FVC 2.70 (78% predicted), postbronchodilator ratio 86, postbronchodilator FEV1 2.26 (83% predicted), no bronchodilator response, TLC 4.21 (83% predicted), DLCO 18.15 (87% predicted)  04/04/20 - Zio -  Sinus rhythm with average heart rate of 71 bpm  Rare PVCs, premature ventricular contractions, rare PACs, premature atrial contractions  No atrial fibrillation, no pauses, no adverse arrhythmias  FENO:  No results found for: NITRICOXIDE  PFT: PFT Results Latest Ref Rng & Units 04/17/2020  FVC-Pre L 2.70  FVC-Predicted Pre % 78  FVC-Post L 2.64  FVC-Predicted Post % 76  Pre FEV1/FVC % % 83  Post FEV1/FCV % % 86  FEV1-Pre L 2.23  FEV1-Predicted Pre % 82  FEV1-Post L 2.26  DLCO UNC% % 87  DLCO COR %Predicted % 116  TLC L 4.21  TLC % Predicted % 83  RV % Predicted % 82    WALK:  SIX MIN WALK 01/31/2020  Supplimental Oxygen during Test? (L/min) No  Baseline Heartrate 90  Baseline SPO2 98  2 Minute Oxygen Saturation % 97  2 Minute HR 97  4 Minute Oxygen Saturation % 93  4 Minute HR 91  6 Minute Oxygen Saturation % 97  6 Minute HR 91  Heartrate 96  SPO2 96  Stopped or Paused before Six Minutes No  Tech Comments: [facility] app[facility] [place] tomorrow at 9:00 a.m.    Imaging: LONG TERM MONITOR (3-14 DAYS)  Result Date: 04/07/2020  Sinus rhythm with average heart rate of 71 bpm  Rare PVCs, premature ventricular contractions, rare PACs, premature atrial contractions  No atrial fibrillation, no pauses, no adverse arrhythmias  Reassuring  monitor with rare ventricular and atrial ectopy. Donato Schultz, MD    Lab Results:  CBC    Component Value Date/Time   WBC 7.4 02/01/2020 1045   WBC 13.8 (H) 10/07/2019 0151   RBC 5.13 02/01/2020 1045   RBC 5.14 (H) 10/07/2019 0151   HGB 15.1 02/01/2020 1045   HCT 44.7 02/01/2020 1045   PLT 346 02/01/2020 1045   MCV 87 02/01/2020 1045   MCH 29.4 02/01/2020 1045   MCH 28.2 10/07/2019 0151   MCHC 33.8 02/01/2020 1045   MCHC 33.3 10/07/2019 0151   RDW 12.5 02/01/2020 1045   LYMPHSABS 2.0 02/01/2020 1045   MONOABS 0.9 10/07/2019 0151   EOSABS 0.2 02/01/2020 1045   BASOSABS 0.1 02/01/2020 1045    BMET    Component Value Date/Time   NA 141 03/19/2020 1000   NA 139 02/23/2020 0953   K 4.4 03/19/2020 1000   CL 108 03/19/2020 1000   CO2 25 03/19/2020 1000   GLUCOSE 93 03/19/2020 1000   BUN 20 03/19/2020 1000   BUN 22 02/23/2020 0953   CREATININE 1.36 (H) 03/19/2020 1000   CALCIUM 9.0 03/19/2020 1000   GFRNONAA 44 (L) 03/19/2020 1000   GFRAA 51 (L) 03/19/2020 1000    BNP No results found for: BNP  ProBNP No results found for: PROBNP  Specialty Problems      Pulmonary Problems   Dyspnea  on exertion   DOE (dyspnea on exertion)   Pulmonary nodules      No Known Allergies  Immunization History  Administered Date(s) Administered  . Influenza-Unspecified 07/07/2019  . PFIZER SARS-COV-2 Vaccination 11/10/2019, 12/01/2019    Past Medical History:  Diagnosis Date  . Acute respiratory failure with hypoxia (HCC) 02/01/2020  . Acute respiratory failure with hypoxia (HCC) 02/01/2020  . Adjustment insomnia 12/11/2019  . AKI (acute kidney injury) (HCC) 10/02/2019  . COVID-19   . Diarrhea due to COVID-19 10/02/2019  . Dyspnea on exertion 02/01/2020  . Elevated serum creatinine 12/11/2019  . Extreme exhaustion 11/24/2019  . Fatigue 02/01/2020  . Headache   . Heart palpitations 02/01/2020  . History of COVID-19 02/01/2020  . Hyperlipidemia   . Hypothyroidism   . Migraine headache  10/02/2019  . Mixed hyperlipidemia 11/16/2019  . Morbid (severe) obesity due to excess calories (HCC)   . Obesity (BMI 30-39.9) 10/02/2019  . Other chest pain 11/26/2019  . Physical deconditioning 02/01/2020  . 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 Sore throat 12/11/2019  . Tension-type headache, not intractable 11/26/2019  . Tension-type headache, not intractable 11/26/2019    Tobacco History: Social History   Tobacco Use  Smoking Status Never Smoker  Smokeless Tobacco Never Used   Counseling given: Not Answered   Continue to not smoke  Outpatient Encounter Medications as of 04/23/2020  Medication Sig  . albuterol (VENTOLIN HFA) 108 (90 Base) MCG/ACT inhaler Inhale 2 puffs into the lungs every 6 (six) hours as needed for wheezing or shortness of breath.  Marland Kitchen amitriptyline (ELAVIL) 25 MG tablet Take 1 tablet (25 mg total) by mouth at bedtime.  [name] (AIMOVIG) 140 MG/ML SOAJ Inject 140 mg into the skin every 30 (thirty) days.  Janann August 112 MCG tablet Take 1 tablet by mouth once daily  . Fluticasone-Salmeterol (ADVAIR DISKUS) 250-50 MCG/DOSE AEPB Inhale 1 puff into the lungs 2 (two) times daily.  . meloxicam (MOBIC) 7.5 MG tablet Take 1 tablet by mouth once daily  . metoprolol tartrate (LOPRESSOR) 100 MG tablet Take one tablet two hours prior to CT procedure.  . nadolol (CORGARD) 80 MG tablet Take 2 tablets by mouth once daily  . Rimegepant Sulfate (NURTEC) 75 MG TBDP Take at onset of migraine/aura. May NOT repeat dosing for 24 hrs.  . rosuvastatin (CRESTOR) 20 MG tablet Take 1 tablet (20 mg total) by mouth daily.  . SUMAtriptan (IMITREX) 100 MG tablet TAKE 1 TABLET BY MOUTH AT ONSET OF HEADACHE, MAY REPEAT IN 2 HOURS IF NEEDED  . Vitamin D, Ergocalciferol, (DRISDOL) 1.25 MG (50000 UNIT) CAPS capsule Take 1 capsule (50,000 Units total) by mouth every 7 (seven) days.   No facility-administered encounter medications on file as of 04/23/2020.     Review of Systems  Review of  Systems  Constitutional: Positive for activity change and fatigue. Negative for fever.  HENT: Positive for congestion. Negative for sinus pressure, sinus pain and sore throat.   Respiratory: Positive for cough (Specifically with exertion), shortness of breath and wheezing (Occasionally with exertion).   Cardiovascular: Positive for palpitations (Has been followed by cardiology, Zio patch has been worn). Negative for chest pain.  Gastrointestinal: Negative for diarrhea, nausea and vomiting.  Musculoskeletal: Negative for arthralgias.  Neurological: Negative for dizziness.  Psychiatric/Behavioral: Negative for sleep disturbance. The patient is not nervous/anxious.      Physical Exam  BP 124/80 (BP Location: Left Arm, Cuff Size: Normal)   Pulse 68   Temp (!) 97.5  F (36.4 C) (Oral)   Ht 5\' 6"  (1.676 m)   Wt (!) 234 lb 6.4 oz (106.3 kg)   SpO2 97%   BMI 37.83 kg/m   Wt Readings from Last 5 Encounters:  04/23/20 (!) 234 lb 6.4 oz (106.3 kg)  03/19/20 231 lb (104.8 kg)  03/05/20 231 lb (104.8 kg)  03/04/20 232 lb 3.2 oz (105.3 kg)  02/23/20 232 lb (105.2 kg)    BMI Readings from Last 5 Encounters:  04/23/20 37.83 kg/m  03/19/20 37.28 kg/m  03/05/20 37.28 kg/m  03/04/20 37.48 kg/m  02/23/20 37.45 kg/m     Physical Exam Vitals and nursing note reviewed.  Constitutional:      General: She is not in acute distress.    Appearance: Normal appearance.  HENT:     Head: Normocephalic and atraumatic.     Right Ear: External ear normal.     Left Ear: External ear normal.  Eyes:     Pupils: Pupils are equal, round, and reactive to light.  Cardiovascular:     Rate and Rhythm: Normal rate and regular rhythm.     Pulses: Normal pulses.     Heart sounds: Normal heart sounds. No murmur heard.   Pulmonary:     Effort: Pulmonary effort is normal. No respiratory distress.     Breath sounds: Normal breath sounds. No decreased air movement. No decreased breath sounds, wheezing or  rales.  Musculoskeletal:     Cervical back: Normal range of motion.     Right lower leg: No edema.     Left lower leg: No edema.  Skin:    General: Skin is warm and dry.     Capillary Refill: Capillary refill takes less than 2 seconds.  Neurological:     General: No focal deficit present.     Mental Status: She is alert and oriented to person, place, and time. Mental status is at baseline.     Gait: Gait normal.  Psychiatric:        Mood and Affect: Mood normal.        Behavior: Behavior normal.        Thought Content: Thought content normal.        Judgment: Judgment normal.       Assessment & Plan:   DOE (dyspnea on exertion) Reviewed pulmonary function testing today Reviewed high-resolution CT chest Unfortunately believe patient is continuing to deal with symptoms of long Covid as well as physical deconditioning  Plan: Continue Advair 250 since we have felt there is clinical benefit Continue risk inhaler Continue to work with outpatient physical therapy We will repeat a CT chest in 3 months to follow pulmonary nodules Follow-up with our office in 3 to 4 months after CT chest  History of COVID-19 Reviewed high-resolution CT chest with patient Reviewed pulmonary function testing with patient Patient feels clinical benefit since starting ICS/LABA-Advair 250  Plan: Continue Advair 250 Continue to work with outpatient physical therapy Could consider pulmonary rehab referral in the future We will tentatively plan on repeating a CT of her chest in 3 months Could also consider repeating high-resolution CT chest in 1 year if we are continuing to have persistent symptoms to see if groundglass opacities and mild inflammation seen on recent high-resolution CT chest has progressed Work on increasing overall physical activity at this time  Physical deconditioning This continues to be a contributing component for the patient status post Covid  Plan: Continue to work with  outpatient physical therapy  Could consider referral to pulmonary rehab in the future  Pulmonary nodules Pulmonary nodule seen on recent high-resolution CT chest  Plan: We will repeat a CT of the patient's chest in 3 months Follow-up with our office in 3 months    Return in about 3 months (around 07/24/2020), or if symptoms worsen or fail to improve, for After Chest CT, Follow up with Dr. Chestine Sporelark.   Coral CeoBrian P Tanishi Nault, NP 04/23/2020   This appointment required 44 minutes of patient care (this includes precharting, chart review, review of results, face-to-face care, etc.).

## 2020-04-23 NOTE — Assessment & Plan Note (Signed)
Pulmonary nodule seen on recent high-resolution CT chest  Plan: We will repeat a CT of the patient's chest in 3 months Follow-up with our office in 3 months

## 2020-04-23 NOTE — Assessment & Plan Note (Signed)
This continues to be a contributing component for the patient status post Covid  Plan: Continue to work with outpatient physical therapy Could consider referral to pulmonary rehab in the future

## 2020-04-23 NOTE — Assessment & Plan Note (Signed)
Reviewed pulmonary function testing today Reviewed high-resolution CT chest Unfortunately believe patient is continuing to deal with symptoms of long Covid as well as physical deconditioning  Plan: Continue Advair 250 since we have felt there is clinical benefit Continue risk inhaler Continue to work with outpatient physical therapy We will repeat a CT chest in 3 months to follow pulmonary nodules Follow-up with our office in 3 to 4 months after CT chest

## 2020-04-23 NOTE — Patient Instructions (Addendum)
You were seen today by Coral Ceo, NP  for:   1. History of COVID-19 2. DOE (dyspnea on exertion)  Continue Advair 250  Continue with outpatient physical therapy  If you become interested in pulmonary rehab referral please let our office know  We will see you back in 3 to 4 months after completing your follow-up CT chest  3. Pulmonary nodules  - CT Chest Wo Contrast; Future  We will repeat a CT of your chest in 3 months  4. Physical deconditioning  Continue outpatient physical therapy  If you become interested in pulmonary rehab please let our office know we can place this referral   We recommend today:  Orders Placed This Encounter  Procedures  . CT Chest Wo Contrast    Standing Status:   Future    Standing Expiration Date:   04/23/2021    Scheduling Instructions:     Completed in late oct/2021    Order Specific Question:   Is patient pregnant?    Answer:   No    Order Specific Question:   Preferred imaging location?    Answer:   [facility]    Order Specific Question:   Radiology Contrast Protocol - do NOT remove file path    Answer:   [email or web address]   Orders Placed This Encounter  Procedures  . CT Chest Wo Contrast   No orders of the defined types were placed in this encounter.   Follow Up:    Return in about 3 months (around 07/24/2020), or if symptoms worsen or fail to improve, for After Chest CT, Follow up with Dr. Chestine Spore.   Please do your part to reduce the spread of COVID-19:      Reduce your risk of any infection  and COVID19 by using the similar precautions used for avoiding the common cold or flu:  Marland Kitchen Wash your hands often with soap and warm water for at least 20 seconds.  If soap and water are not readily available, use an alcohol-based hand sanitizer with at least 60% alcohol.  . If coughing or sneezing, cover your mouth and nose by coughing or sneezing into the elbow areas of your shirt or coat, into a  tissue or into your sleeve (not your hands). Drinda Butts A MASK when in public  . Avoid shaking hands with others and consider head nods or verbal greetings only. . Avoid touching your eyes, nose, or mouth with unwashed hands.  . Avoid close contact with people who are sick. . Avoid places or events with large numbers of people in one location, like concerts or sporting events. . If you have some symptoms but not all symptoms, continue to monitor at home and seek medical attention if your symptoms worsen. . If you are having a medical emergency, call 911.   ADDITIONAL HEALTHCARE OPTIONS FOR PATIENTS  [facility] / e-Visit: [email or web address]         [facility]: [phone]  [facility]: [phone]                   [facility]: [phone]     It is flu season:   >>> Best ways to protect herself from the flu: Receive the yearly flu vaccine, practice good hand hygiene washing with soap and also using hand sanitizer when available, eat a nutritious meals, get adequate rest, hydrate appropriately   Please contact the office if your symptoms worsen or  you have concerns that you are not improving.   Thank you for choosing [facility] for your healthcare, and for allowing [place] to partner with you on your healthcare journey. I am thankful to be able to provide care to you today.   Wyn Quaker FNP-C

## 2020-05-06 DIAGNOSIS — U071 COVID-19: Secondary | ICD-10-CM | POA: Diagnosis not present

## 2020-05-07 ENCOUNTER — Other Ambulatory Visit: Payer: Self-pay

## 2020-05-07 ENCOUNTER — Ambulatory Visit: Payer: BC Managed Care – PPO | Attending: Nurse Practitioner | Admitting: Physical Therapy

## 2020-05-07 DIAGNOSIS — M6281 Muscle weakness (generalized): Secondary | ICD-10-CM | POA: Diagnosis not present

## 2020-05-07 DIAGNOSIS — Z8616 Personal history of COVID-19: Secondary | ICD-10-CM

## 2020-05-07 DIAGNOSIS — R29898 Other symptoms and signs involving the musculoskeletal system: Secondary | ICD-10-CM

## 2020-05-07 DIAGNOSIS — R262 Difficulty in walking, not elsewhere classified: Secondary | ICD-10-CM

## 2020-05-07 NOTE — Therapy (Signed)
[facility] [address] [place], [place], [address] Phone: [phone]   Fax:  [phone]  Physical Therapy Treatment and Re-Evaluation  Patient Details  Name: Taylor Ho MRN: [id number] Date of Birth: 03-12-1966 Referring Provider (PT): Ivonne Andrew, NP   Encounter Date: 05/07/2020   PT End of Session - 05/07/20 1355    Visit Number 5    Number of Visits 6    Date for PT Re-Evaluation 04/26/20    Authorization Type BCBS    PT Start Time 1316    PT Stop Time 1404    PT Time Calculation (min) 48 min    Activity Tolerance Patient tolerated treatment well    Behavior During Therapy [facility] for tasks assessed/performed           Past Medical History:  Diagnosis Date  . Acute respiratory failure with hypoxia (HCC) 02/01/2020  . Acute respiratory failure with hypoxia (HCC) 02/01/2020  . Adjustment insomnia 12/11/2019  . AKI (acute kidney injury) (HCC) 10/02/2019  . COVID-19   . Diarrhea due to COVID-19 10/02/2019  . Dyspnea on exertion 02/01/2020  . Elevated serum creatinine 12/11/2019  . Extreme exhaustion 11/24/2019  . Fatigue 02/01/2020  . Headache   . Heart palpitations 02/01/2020  . History of COVID-19 02/01/2020  . Hyperlipidemia   . Hypothyroidism   . Migraine headache 10/02/2019  . Mixed hyperlipidemia 11/16/2019  . Morbid (severe) obesity due to excess calories (HCC)   . Obesity (BMI 30-39.9) 10/02/2019  . Other chest pain 11/26/2019  . Physical deconditioning 02/01/2020  . 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 Sore throat 12/11/2019  . Tension-type headache, not intractable 11/26/2019  . Tension-type headache, not intractable 11/26/2019    Past Surgical History:  Procedure Laterality Date  . CHOLECYSTECTOMY    . KNEE ARTHROSCOPY Left 08/13/2017   Procedure: ARTHROSCOPY KNEE;  Surgeon: Jodi Geralds, MD;  Location: [facility];  Service: Orthopedics;  Laterality: Left;  . TUBAL LIGATION      There were no vitals filed for this  visit.   Subjective Assessment - 05/07/20 1318    Subjective Pt states she's supposed to be going to the beach this Saturday and has to traverse a big set of stairs. Pt reports she's been about the same. Pt states every day is different.    Pertinent History COVID    Limitations Walking;House hold activities    Diagnostic tests CT and XR    Patient Stated Goals Improve endurance and activity tolerance for daily tasks and return to work    Currently in Pain? No/denies                             [facility] Adult PT Treatment/Exercise - 05/07/20 0001      Knee/Hip Exercises: Aerobic   Tread Mill 5% grade, 0.6 to 1.1 mph x 10 min   5/10 RPE     Knee/Hip Exercises: Standing   Heel Raises Both;20 reps   on airex   SLS On airex: 2x30 sec bilat    Other Standing Knee Exercises On airex, marching with head turns up/down x 1 min, head turns side to side x 1 min    Other Standing Knee Exercises 10 steps up/down 3 x 1 min, walking on gymnastics mat 1 min - 2 min - 3 min   5/10 RPE  PT Short Term Goals - 03/15/20 1210      PT SHORT TERM GOAL #1   Title Pt will have RPE of </= 3/10 after performing 10 squats with 10# weight    Baseline RPE of 5/10 after 10 squats with 10# weight    Time 3    Period Weeks    Status New    Target Date 04/05/20             PT Long Term Goals - 03/15/20 1217      PT LONG TERM GOAL #1   Title Pt will be independent with HEP    Baseline New    Time 6    Period Weeks    Status New    Target Date 04/26/20      PT LONG TERM GOAL #2   Title Pt will be able to walk 6 minutes on treadmill with 5% elevation at 1.3 mph with RPE </= 2/10    Baseline RPE 5/10    Time 6    Period Weeks    Status New    Target Date 04/26/20      PT LONG TERM GOAL #3   Title Pt will be able to tolerate ambulating 1 mile with RPE <2/10    Baseline Unable    Time 6    Period Weeks    Status New    Target Date 04/26/20      PT  LONG TERM GOAL #4   Title Pt will have improved FOTO score to </= 40% impairment    Baseline 54% impairment    Time 6    Period Weeks    Status New    Target Date 04/26/20                 Plan - 05/07/20 1439    Clinical Impression Statement Pt to go to the beach -- treatment focused on improving balance and stability for steps in/out of beach house and for ambulating on sand. SpO2 90 - 95% throughout treatment. Pt with slow gains reaching goal at this time. Re-eval demonstrates pt is able to increase amb; however, still has a 5/10 RPE for activities. Pt's FOTO score with very little change    Personal Factors and Comorbidities Fitness;Comorbidity 1;Time since onset of injury/illness/exacerbation    Comorbidities COVID    Examination-Activity Limitations Caring for Others;Hygiene/Grooming;Lift;Stairs;Locomotion Level    Examination-Participation Restrictions Psychiatric nurse;Shop;Yard Work    Stability/Clinical Decision Making Stable/Uncomplicated    Rehab Potential Good    PT Frequency 1x / week    PT Duration 6 weeks    PT Treatment/Interventions ADLs/Self Care Home Management;Cryotherapy;Electrical Stimulation;Iontophoresis 4mg /ml Dexamethasone;Moist Heat;Gait training;Stair training;Functional mobility training;Therapeutic activities;Therapeutic exercise;Balance training;Neuromuscular re-education;Patient/family education;Manual techniques;Passive range of motion;Dry needling;Energy conservation;Taping    PT Next Visit Plan Continue whole body strengthening and endurance. Progress planking and proprioception/balance.    PT Home Exercise Plan Access Code [facility]    Consulted and Agree with Plan of Care Patient           Patient will benefit from skilled therapeutic intervention in order to improve the following deficits and impairments:  Difficulty walking, Decreased endurance, Decreased activity tolerance  Visit Diagnosis: Muscle weakness  (generalized)  Difficulty in walking, not elsewhere classified  Other symptoms and signs involving the musculoskeletal system  History of COVID-19     Problem List Patient Active Problem List   Diagnosis Date Noted  . DOE (dyspnea on exertion) 04/23/2020  . Pulmonary nodules 04/23/2020  .  Intractable migraine without aura and without status migrainosus 03/10/2020  . Palpitations 02/01/2020  . Dyspnea on exertion 02/01/2020  . History of COVID-19 02/01/2020  . Other fatigue 02/01/2020  . Physical deconditioning 02/01/2020  . Adjustment insomnia 12/11/2019  . Other chest pain 11/26/2019  . Extreme exhaustion 11/24/2019  . Mixed hyperlipidemia 11/16/2019  . Migraine headache 10/02/2019  . 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 Obesity (BMI 30-39.9) 10/02/2019    Aahan Marques April Dell Ponto 05/07/2020, 2:48 PM  [facility] [address] [place], [place], [address] Phone: [phone]   Fax:  [phone]  Name: Taylor Ho MRN: [id number] Date of Birth: 12/12/1965

## 2020-05-16 ENCOUNTER — Other Ambulatory Visit: Payer: Self-pay

## 2020-05-16 MED ORDER — NURTEC 75 MG PO TBDP: ORAL_TABLET | ORAL | 1 refills | Status: DC

## 2020-05-21 ENCOUNTER — Other Ambulatory Visit: Payer: Self-pay

## 2020-05-21 ENCOUNTER — Ambulatory Visit: Payer: BC Managed Care – PPO | Admitting: Physical Therapy

## 2020-05-21 DIAGNOSIS — R262 Difficulty in walking, not elsewhere classified: Secondary | ICD-10-CM

## 2020-05-21 DIAGNOSIS — M6281 Muscle weakness (generalized): Secondary | ICD-10-CM | POA: Diagnosis not present

## 2020-05-21 DIAGNOSIS — R29898 Other symptoms and signs involving the musculoskeletal system: Secondary | ICD-10-CM | POA: Diagnosis not present

## 2020-05-21 DIAGNOSIS — Z8616 Personal history of COVID-19: Secondary | ICD-10-CM

## 2020-05-21 NOTE — Therapy (Signed)
[place], [place], [address] Phone: [phone]   Fax:  [phone]  Physical Therapy Treatment  Patient Details  Name: Taylor Ho MRN: [id number] Date of Birth: 23-Jun-1966 Referring Provider (PT): Fenton Foy, NP   Encounter Date: 05/21/2020   PT End of Session - 05/21/20 1311    Visit Number 6    Number of Visits 12    Date for PT Re-Evaluation 06/18/20    Authorization Type BCBS    PT Start Time 1315    PT Stop Time 1400    PT Time Calculation (min) 45 min    Activity Tolerance Patient tolerated treatment well    Behavior During Therapy [facility] for tasks assessed/performed           Past Medical History:  Diagnosis Date  . Acute respiratory failure with hypoxia ([place]) 02/01/2020  . Acute respiratory failure with hypoxia ([place]) 02/01/2020  . Adjustment insomnia 12/11/2019  . AKI (acute kidney injury) ([place]) 10/02/2019  . COVID-19   . Diarrhea due to COVID-19 10/02/2019  . Dyspnea on exertion 02/01/2020  . Elevated serum creatinine 12/11/2019  . Extreme exhaustion 11/24/2019  . Fatigue 02/01/2020  . Headache   . Heart palpitations 02/01/2020  . History of COVID-19 02/01/2020  . Hyperlipidemia   . Hypothyroidism   . Migraine headache 10/02/2019  . Mixed hyperlipidemia 11/16/2019  . Morbid (severe) obesity due to excess calories ([place])   . Obesity (BMI 30-39.9) 10/02/2019  . Other chest pain 11/26/2019  . Physical deconditioning 02/01/2020  . 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 Sore throat 12/11/2019  . Tension-type headache, not intractable 11/26/2019  . Tension-type headache, not intractable 11/26/2019    Past Surgical History:  Procedure Laterality Date  . CHOLECYSTECTOMY    . KNEE ARTHROSCOPY Left 08/13/2017   Procedure: ARTHROSCOPY KNEE;  Surgeon: Dorna Leitz, MD;  Location: [place];  Service: Orthopedics;  Laterality: Left;  . TUBAL LIGATION      There were no vitals filed for this visit.    Subjective Assessment - 05/21/20 1317    Subjective Pt reports that she got very fatigued after the beach trip. Pt states she was so tired all of the time. Pt reports upon return home she slept for most of the day. Pt states she was able to go to the beach once and walked for a little bit. Pt reports increased soreness.    Pertinent History COVID    Limitations Walking;House hold activities    Diagnostic tests CT and XR    Patient Stated Goals Improve endurance and activity tolerance for daily tasks and return to work    Currently in Pain? No/denies                             [facility] Adult PT Treatment/Exercise - 05/21/20 0001      Knee/Hip Exercises: Aerobic   Tread Mill 5% grade, 0.6 to 1.5 mph x 12 min   4/10 RPE     Knee/Hip Exercises: Machines for Strengthening   Cybex Knee Extension 35# 3x10    Cybex Knee Flexion 45# 3x10   3/10 RPE   Total Gym Leg Press DL 55# 3x10    Hip Cybex 25# 3x10      Knee/Hip Exercises: Standing   Other Standing Knee Exercises deadlift with 5# 3x10  PT Short Term Goals - 05/07/20 2214      PT SHORT TERM GOAL #1   Title Pt will have RPE of </= 3/10 after performing 10 squats with 10# weight    Baseline RPE of 5/10 after 10 squats with 10# weight    Time 3    Period Weeks    Status Partially Met    Target Date 04/05/20             PT Long Term Goals - 05/07/20 2214      PT LONG TERM GOAL #1   Title Pt will be independent with HEP    Baseline New    Time 6    Period Weeks    Status Revised    Target Date 06/18/20      PT LONG TERM GOAL #2   Title Pt will be able to walk 6 minutes on treadmill with 5% elevation at 1.3 mph with RPE </= 2/10    Baseline RPE 5/10    Time 6    Period Weeks    Status Revised    Target Date 06/18/20      PT LONG TERM GOAL #3   Title Pt will be able to tolerate ambulating 1 mile with RPE <2/10    Baseline Unable    Time 6    Period Weeks    Status Revised     Target Date 06/18/20      PT LONG TERM GOAL #4   Title Pt will have improved FOTO score to </= 40% impairment    Baseline 54% impairment    Time 6    Period Weeks    Status Revised    Target Date 06/18/20                 Plan - 05/21/20 1339    Clinical Impression Statement Treatment focused on continued improvement with whole body strengthening and endurance. Initiated deadlifts and strengthening using gym equipment. RPE 3 to 4/10.    Personal Factors and Comorbidities Fitness;Comorbidity 1;Time since onset of injury/illness/exacerbation    Comorbidities COVID    Examination-Activity Limitations Caring for Others;Hygiene/Grooming;Lift;Stairs;Locomotion Level    Examination-Participation Restrictions Estate agent;Shop;Yard Work    Stability/Clinical Decision Making Stable/Uncomplicated    Rehab Potential Good    PT Frequency 1x / week    PT Duration 6 weeks    PT Treatment/Interventions ADLs/Self Care Home Management;Cryotherapy;Electrical Stimulation;Iontophoresis 46m/ml Dexamethasone;Moist Heat;Gait training;Stair training;Functional mobility training;Therapeutic activities;Therapeutic exercise;Balance training;Neuromuscular re-education;Patient/family education;Manual techniques;Passive range of motion;Dry needling;Energy conservation;Taping    PT Next Visit Plan Continue whole body strengthening and endurance. Progress planking and proprioception/balance.    PT Home Exercise Plan Access Code [facility] Deadlift, forward T, squat    Consulted and Agree with Plan of Care Patient           Patient will benefit from skilled therapeutic intervention in order to improve the following deficits and impairments:  Difficulty walking, Decreased endurance, Decreased activity tolerance  Visit Diagnosis: Muscle weakness (generalized)  Difficulty in walking, not elsewhere classified  Other symptoms and signs involving the musculoskeletal system  History  of COVID-19     Problem List Patient Active Problem List   Diagnosis Date Noted  . DOE (dyspnea on exertion) 04/23/2020  . Pulmonary nodules 04/23/2020  . Intractable migraine without aura and without status migrainosus 03/10/2020  . Palpitations 02/01/2020  . Dyspnea on exertion 02/01/2020  . History of COVID-19 02/01/2020  . Other fatigue 02/01/2020  . Physical deconditioning  02/01/2020  . Adjustment insomnia 12/11/2019  . Other chest pain 11/26/2019  . Extreme exhaustion 11/24/2019  . Mixed hyperlipidemia 11/16/2019  . Migraine headache 10/02/2019  . 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 Obesity (BMI 30-39.9) 10/02/2019    Connelly [place] April Gordy Levan 05/21/2020, 2:04 PM  [facility] [address] [place], [place], [address] Phone: [phone]   Fax:  [phone]  Name: Taylor Ho MRN: [id number] Date of Birth: 08-26-1966

## 2020-05-22 ENCOUNTER — Other Ambulatory Visit: Payer: Self-pay | Admitting: Physician Assistant

## 2020-05-28 ENCOUNTER — Encounter: Payer: Self-pay | Admitting: Nurse Practitioner

## 2020-05-29 ENCOUNTER — Telehealth: Payer: Self-pay | Admitting: *Deleted

## 2020-05-29 NOTE — Telephone Encounter (Signed)
Spoke with patient, she was returning a call from someone calling to schedule her CT scan.  Transferred to [employer], pcc.  Nothing further needed.

## 2020-05-30 ENCOUNTER — Other Ambulatory Visit: Payer: Self-pay

## 2020-05-30 ENCOUNTER — Encounter: Payer: Self-pay | Admitting: Physical Therapy

## 2020-05-30 ENCOUNTER — Ambulatory Visit: Attending: Nurse Practitioner | Admitting: Physical Therapy

## 2020-05-30 DIAGNOSIS — R29898 Other symptoms and signs involving the musculoskeletal system: Secondary | ICD-10-CM | POA: Insufficient documentation

## 2020-05-30 DIAGNOSIS — R262 Difficulty in walking, not elsewhere classified: Secondary | ICD-10-CM | POA: Insufficient documentation

## 2020-05-30 DIAGNOSIS — Z8616 Personal history of COVID-19: Secondary | ICD-10-CM | POA: Insufficient documentation

## 2020-05-30 DIAGNOSIS — M6281 Muscle weakness (generalized): Secondary | ICD-10-CM | POA: Insufficient documentation

## 2020-05-30 NOTE — Therapy (Signed)
[place], [place], [address] Phone: [phone]   Fax:  [phone]  Physical Therapy Treatment  Patient Details  Name: Taylor Ho MRN: [id number] Date of Birth: 1966/03/20 Referring Provider (PT): Fenton Foy, NP   Encounter Date: 05/30/2020   PT End of Session - 05/30/20 0827    Visit Number 7    Number of Visits 12    Date for PT Re-Evaluation 06/18/20    Authorization Type BCBS    PT Start Time 0830    PT Stop Time 0915    PT Time Calculation (min) 45 min    Activity Tolerance Patient tolerated treatment well    Behavior During Therapy [facility] for tasks assessed/performed           Past Medical History:  Diagnosis Date  . Acute respiratory failure with hypoxia ([place]) 02/01/2020  . Acute respiratory failure with hypoxia ([place]) 02/01/2020  . Adjustment insomnia 12/11/2019  . AKI (acute kidney injury) ([place]) 10/02/2019  . COVID-19   . Diarrhea due to COVID-19 10/02/2019  . Dyspnea on exertion 02/01/2020  . Elevated serum creatinine 12/11/2019  . Extreme exhaustion 11/24/2019  . Fatigue 02/01/2020  . Headache   . Heart palpitations 02/01/2020  . History of COVID-19 02/01/2020  . Hyperlipidemia   . Hypothyroidism   . Migraine headache 10/02/2019  . Mixed hyperlipidemia 11/16/2019  . Morbid (severe) obesity due to excess calories ([place])   . Obesity (BMI 30-39.9) 10/02/2019  . Other chest pain 11/26/2019  . Physical deconditioning 02/01/2020  . 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 Sore throat 12/11/2019  . Tension-type headache, not intractable 11/26/2019  . Tension-type headache, not intractable 11/26/2019    Past Surgical History:  Procedure Laterality Date  . CHOLECYSTECTOMY    . KNEE ARTHROSCOPY Left 08/13/2017   Procedure: ARTHROSCOPY KNEE;  Surgeon: Dorna Leitz, MD;  Location: [place];  Service: Orthopedics;  Laterality: Left;  . TUBAL LIGATION      There were no vitals filed for this visit.   Subjective  Assessment - 05/30/20 0834    Subjective Pt reports her days feel repetitive. She states she has a few days when she feels good and then some days she can't seem to get going.    Pertinent History COVID    Limitations Walking;House hold activities    Diagnostic tests CT and XR    Patient Stated Goals Improve endurance and activity tolerance for daily tasks and return to work    Currently in Pain? No/denies                             [facility] Adult PT Treatment/Exercise - 05/30/20 0001      Knee/Hip Exercises: Aerobic   Tread Mill 5% grade, 0.6 to 1.5 mph x 16 min   4 or 5/10     Knee/Hip Exercises: Machines for Strengthening   Total Gym Leg Press DL 65# 3x10    Other Machine 25# 3x10 lat pull down; 15# 3x10 chest press      Knee/Hip Exercises: Standing   Other Standing Knee Exercises Squat 15# x10    Other Standing Knee Exercises Deadlift 15# x10                    PT Short Term Goals - 05/07/20 2214      PT SHORT TERM GOAL #1   Title Pt will have RPE of </=  3/10 after performing 10 squats with 10# weight    Baseline RPE of 5/10 after 10 squats with 10# weight    Time 3    Period Weeks    Status Partially Met    Target Date 04/05/20             PT Long Term Goals - 05/07/20 2214      PT LONG TERM GOAL #1   Title Pt will be independent with HEP    Baseline New    Time 6    Period Weeks    Status Revised    Target Date 06/18/20      PT LONG TERM GOAL #2   Title Pt will be able to walk 6 minutes on treadmill with 5% elevation at 1.3 mph with RPE </= 2/10    Baseline RPE 5/10    Time 6    Period Weeks    Status Revised    Target Date 06/18/20      PT LONG TERM GOAL #3   Title Pt will be able to tolerate ambulating 1 mile with RPE <2/10    Baseline Unable    Time 6    Period Weeks    Status Revised    Target Date 06/18/20      PT LONG TERM GOAL #4   Title Pt will have improved FOTO score to </= 40% impairment    Baseline 54%  impairment    Time 6    Period Weeks    Status Revised    Target Date 06/18/20                 Plan - 05/30/20 0854    Clinical Impression Statement Continued to progress pt's whole body strengthening and endurance. Pt continues to tolerate machine strengthening well. RPE 4 to 5/10. Progressed pt's squatting and deadlifting to 15#.    Personal Factors and Comorbidities Fitness;Comorbidity 1;Time since onset of injury/illness/exacerbation    Comorbidities COVID    Examination-Activity Limitations Caring for Others;Hygiene/Grooming;Lift;Stairs;Locomotion Level    Examination-Participation Restrictions Estate agent;Shop;Yard Work    Stability/Clinical Decision Making Stable/Uncomplicated    Rehab Potential Good    PT Frequency 1x / week    PT Duration 6 weeks    PT Treatment/Interventions ADLs/Self Care Home Management;Cryotherapy;Electrical Stimulation;Iontophoresis 17m/ml Dexamethasone;Moist Heat;Gait training;Stair training;Functional mobility training;Therapeutic activities;Therapeutic exercise;Balance training;Neuromuscular re-education;Patient/family education;Manual techniques;Passive range of motion;Dry needling;Energy conservation;Taping    PT Next Visit Plan Continue whole body strengthening and endurance. Progress planking and proprioception/balance.    PT Home Exercise Plan Access Code [facility] Deadlift, forward T, squat    Consulted and Agree with Plan of Care Patient           Patient will benefit from skilled therapeutic intervention in order to improve the following deficits and impairments:  Difficulty walking, Decreased endurance, Decreased activity tolerance  Visit Diagnosis: Muscle weakness (generalized)  Difficulty in walking, not elsewhere classified  Other symptoms and signs involving the musculoskeletal system  History of COVID-19     Problem List Patient Active Problem List   Diagnosis Date Noted  . DOE (dyspnea on  exertion) 04/23/2020  . Pulmonary nodules 04/23/2020  . Intractable migraine without aura and without status migrainosus 03/10/2020  . Palpitations 02/01/2020  . Dyspnea on exertion 02/01/2020  . History of COVID-19 02/01/2020  . Other fatigue 02/01/2020  . Physical deconditioning 02/01/2020  . Adjustment insomnia 12/11/2019  . Other chest pain 11/26/2019  . Extreme exhaustion 11/24/2019  . Mixed hyperlipidemia  11/16/2019  . Migraine headache 10/02/2019  . 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 Obesity (BMI 30-39.9) 10/02/2019    Meagen Limones April Ma L Janny Crute PT, DPT 05/30/2020, 1:46 PM  [facility] [address] [place] [employer] [place], [place], [address] Phone: [phone]   Fax:  [phone]  Name: Taylor Ho MRN: [id number] Date of Birth: [birth year]

## 2020-06-02 ENCOUNTER — Other Ambulatory Visit: Payer: Self-pay | Admitting: Physician Assistant

## 2020-06-02 DIAGNOSIS — G43019 Migraine without aura, intractable, without status migrainosus: Secondary | ICD-10-CM

## 2020-06-05 ENCOUNTER — Encounter: Payer: Self-pay | Admitting: Physical Therapy

## 2020-06-05 ENCOUNTER — Other Ambulatory Visit: Payer: Self-pay

## 2020-06-05 ENCOUNTER — Ambulatory Visit: Admitting: Physical Therapy

## 2020-06-05 DIAGNOSIS — M6281 Muscle weakness (generalized): Secondary | ICD-10-CM

## 2020-06-05 DIAGNOSIS — R29898 Other symptoms and signs involving the musculoskeletal system: Secondary | ICD-10-CM

## 2020-06-05 DIAGNOSIS — R262 Difficulty in walking, not elsewhere classified: Secondary | ICD-10-CM

## 2020-06-05 DIAGNOSIS — Z8616 Personal history of COVID-19: Secondary | ICD-10-CM

## 2020-06-05 NOTE — Therapy (Signed)
[place], [place], [address] Phone: [phone]   Fax:  [phone]  Physical Therapy Treatment  Patient Details  Name: AMELIYA NICOTRA MRN: [id number] Date of Birth: 1965-10-26 Referring Provider (PT): Fenton Foy, NP   Encounter Date: 06/05/2020   PT End of Session - 06/05/20 1300    Visit Number 8    Number of Visits 12    Date for PT Re-Evaluation 06/18/20    Authorization Type BCBS    PT Start Time 1305    PT Stop Time 1354    PT Time Calculation (min) 49 min    Activity Tolerance Patient tolerated treatment well    Behavior During Therapy [facility] for tasks assessed/performed           Past Medical History:  Diagnosis Date  . Acute respiratory failure with hypoxia ([place]) 02/01/2020  . Acute respiratory failure with hypoxia ([place]) 02/01/2020  . Adjustment insomnia 12/11/2019  . AKI (acute kidney injury) ([place]) 10/02/2019  . COVID-19   . Diarrhea due to COVID-19 10/02/2019  . Dyspnea on exertion 02/01/2020  . Elevated serum creatinine 12/11/2019  . Extreme exhaustion 11/24/2019  . Fatigue 02/01/2020  . Headache   . Heart palpitations 02/01/2020  . History of COVID-19 02/01/2020  . Hyperlipidemia   . Hypothyroidism   . Migraine headache 10/02/2019  . Mixed hyperlipidemia 11/16/2019  . Morbid (severe) obesity due to excess calories ([place])   . Obesity (BMI 30-39.9) 10/02/2019  . Other chest pain 11/26/2019  . Physical deconditioning 02/01/2020  . 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 Sore throat 12/11/2019  . Tension-type headache, not intractable 11/26/2019  . Tension-type headache, not intractable 11/26/2019    Past Surgical History:  Procedure Laterality Date  . CHOLECYSTECTOMY    . KNEE ARTHROSCOPY Left 08/13/2017   Procedure: ARTHROSCOPY KNEE;  Surgeon: Dorna Leitz, MD;  Location: [place];  Service: Orthopedics;  Laterality: Left;  . TUBAL LIGATION      There were no vitals filed for this visit.   Subjective  Assessment - 06/05/20 1311    Subjective Pt states things have felt about the same. Pt does note she's been getting increasing L plantar foot pain especially on initial standing after rest.    Pertinent History COVID    Limitations Walking;House hold activities    Diagnostic tests CT and XR    Patient Stated Goals Improve endurance and activity tolerance for daily tasks and return to work    Currently in Pain? No/denies                             [facility] Adult PT Treatment/Exercise - 06/05/20 0001      Knee/Hip Exercises: Stretches   Gastroc Stretch Both;30 seconds    Soleus Stretch Both;30 seconds    Other Knee/Hip Stretches Massage plantar fascia with tennis ball x 1 min      Knee/Hip Exercises: Aerobic   Tread Mill 5% grade, 0.6 to 1.5 mph x 17 min   3/10 RPE; 0.4 miles     Knee/Hip Exercises: Machines for Strengthening   Total Gym Leg Press DL 90# 3x10    Hip Cybex 42.5# 3x10 abduction & extension      Knee/Hip Exercises: Standing   Other Standing Knee Exercises Forward T cone tap x 10; single leg deadlift x10 with 5#   4/10 RPE   Other Standing Knee Exercises Squat 15# x10  PT Education - 06/05/20 1343    Education Details Discussed stretching for plantar fascia    Person(s) Educated Patient    Methods Explanation;Demonstration;Tactile cues;Verbal cues;Handout    Comprehension Verbalized understanding;Returned demonstration;Verbal cues required;Tactile cues required            PT Short Term Goals - 05/07/20 2214      PT SHORT TERM GOAL #1   Title Pt will have RPE of </= 3/10 after performing 10 squats with 10# weight    Baseline RPE of 5/10 after 10 squats with 10# weight    Time 3    Period Weeks    Status Partially Met    Target Date 04/05/20             PT Long Term Goals - 05/07/20 2214      PT LONG TERM GOAL #1   Title Pt will be independent with HEP    Baseline New    Time 6    Period Weeks    Status  Revised    Target Date 06/18/20      PT LONG TERM GOAL #2   Title Pt will be able to walk 6 minutes on treadmill with 5% elevation at 1.3 mph with RPE </= 2/10    Baseline RPE 5/10    Time 6    Period Weeks    Status Revised    Target Date 06/18/20      PT LONG TERM GOAL #3   Title Pt will be able to tolerate ambulating 1 mile with RPE <2/10    Baseline Unable    Time 6    Period Weeks    Status Revised    Target Date 06/18/20      PT LONG TERM GOAL #4   Title Pt will have improved FOTO score to </= 40% impairment    Baseline 54% impairment    Time 6    Period Weeks    Status Revised    Target Date 06/18/20                 Plan - 06/05/20 1343    Clinical Impression Statement Pt with good endurance today -- RPE rated at 3/10 with ambulation. Pt demonstrates increased strength with weight machines this session as well. HR up to 140s. Due to pt c/o L foot pain, provided some stretching exercises for gastroc & foot.    Personal Factors and Comorbidities Fitness;Comorbidity 1;Time since onset of injury/illness/exacerbation    Comorbidities COVID    Examination-Activity Limitations Caring for Others;Hygiene/Grooming;Lift;Stairs;Locomotion Level    Examination-Participation Restrictions Estate agent;Shop;Yard Work    Stability/Clinical Decision Making Stable/Uncomplicated    Rehab Potential Good    PT Frequency 1x / week    PT Duration 6 weeks    PT Treatment/Interventions ADLs/Self Care Home Management;Cryotherapy;Electrical Stimulation;Iontophoresis 69m/ml Dexamethasone;Moist Heat;Gait training;Stair training;Functional mobility training;Therapeutic activities;Therapeutic exercise;Balance training;Neuromuscular re-education;Patient/family education;Manual techniques;Passive range of motion;Dry needling;Energy conservation;Taping    PT Next Visit Plan Continue whole body strengthening and endurance. Progress proprioception/balance.    PT Home Exercise  Plan Access Code [facility] Deadlift, forward T, squat    Consulted and Agree with Plan of Care Patient           Patient will benefit from skilled therapeutic intervention in order to improve the following deficits and impairments:  Difficulty walking, Decreased endurance, Decreased activity tolerance  Visit Diagnosis: Muscle weakness (generalized)  Difficulty in walking, not elsewhere classified  Other symptoms and signs involving the musculoskeletal  system  History of COVID-19     Problem List Patient Active Problem List   Diagnosis Date Noted  . DOE (dyspnea on exertion) 04/23/2020  . Pulmonary nodules 04/23/2020  . Intractable migraine without aura and without status migrainosus 03/10/2020  . Palpitations 02/01/2020  . Dyspnea on exertion 02/01/2020  . History of COVID-19 02/01/2020  . Other fatigue 02/01/2020  . Physical deconditioning 02/01/2020  . Adjustment insomnia 12/11/2019  . Other chest pain 11/26/2019  . Extreme exhaustion 11/24/2019  . Mixed hyperlipidemia 11/16/2019  . Migraine headache 10/02/2019  . 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 Obesity (BMI 30-39.9) 10/02/2019    Bethany Hirt April Ma L Daquane Aguilar PT, DPT 06/05/2020, 2:01 PM  [facility] [address] [place], [place], [address] Phone: [phone]   Fax:  [phone]  Name: TALEISHA KACZYNSKI MRN: [id number] Date of Birth: 1966-04-27

## 2020-06-06 NOTE — Progress Notes (Signed)
No showed

## 2020-06-07 ENCOUNTER — Other Ambulatory Visit: Payer: Self-pay

## 2020-06-07 ENCOUNTER — Ambulatory Visit (INDEPENDENT_AMBULATORY_CARE_PROVIDER_SITE_OTHER): Payer: BC Managed Care – PPO | Admitting: Family Medicine

## 2020-06-07 DIAGNOSIS — G44209 Tension-type headache, unspecified, not intractable: Secondary | ICD-10-CM

## 2020-06-07 DIAGNOSIS — E782 Mixed hyperlipidemia: Secondary | ICD-10-CM

## 2020-06-07 DIAGNOSIS — E039 Hypothyroidism, unspecified: Secondary | ICD-10-CM

## 2020-06-12 ENCOUNTER — Ambulatory Visit: Admitting: Physical Therapy

## 2020-06-12 ENCOUNTER — Other Ambulatory Visit: Payer: Self-pay

## 2020-06-12 ENCOUNTER — Encounter: Payer: Self-pay | Admitting: Physical Therapy

## 2020-06-12 DIAGNOSIS — Z8616 Personal history of COVID-19: Secondary | ICD-10-CM

## 2020-06-12 DIAGNOSIS — M6281 Muscle weakness (generalized): Secondary | ICD-10-CM

## 2020-06-12 DIAGNOSIS — R29898 Other symptoms and signs involving the musculoskeletal system: Secondary | ICD-10-CM

## 2020-06-12 DIAGNOSIS — R262 Difficulty in walking, not elsewhere classified: Secondary | ICD-10-CM

## 2020-06-12 NOTE — Therapy (Signed)
[place], [place], [address] Phone: [phone]   Fax:  [phone]  Physical Therapy Treatment  Patient Details  Name: ZHANA JEANGILLES MRN: [id number] Date of Birth: October 07, 1965 Referring Provider (PT): Fenton Foy, NP   Encounter Date: 06/12/2020   PT End of Session - 06/12/20 1054    Visit Number 9    Number of Visits 12    Date for PT Re-Evaluation 06/18/20    Authorization Type BCBS    PT Start Time 1048    PT Stop Time 1130    PT Time Calculation (min) 42 min    Activity Tolerance Patient tolerated treatment well    Behavior During Therapy [facility] for tasks assessed/performed           Past Medical History:  Diagnosis Date  . Acute respiratory failure with hypoxia ([place]) 02/01/2020  . Acute respiratory failure with hypoxia ([place]) 02/01/2020  . Adjustment insomnia 12/11/2019  . AKI (acute kidney injury) ([place]) 10/02/2019  . COVID-19   . Diarrhea due to COVID-19 10/02/2019  . Dyspnea on exertion 02/01/2020  . Elevated serum creatinine 12/11/2019  . Extreme exhaustion 11/24/2019  . Fatigue 02/01/2020  . Headache   . Heart palpitations 02/01/2020  . History of COVID-19 02/01/2020  . Hyperlipidemia   . Hypothyroidism   . Migraine headache 10/02/2019  . Mixed hyperlipidemia 11/16/2019  . Morbid (severe) obesity due to excess calories ([place])   . Obesity (BMI 30-39.9) 10/02/2019  . Other chest pain 11/26/2019  . Physical deconditioning 02/01/2020  . 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 Sore throat 12/11/2019  . Tension-type headache, not intractable 11/26/2019  . Tension-type headache, not intractable 11/26/2019    Past Surgical History:  Procedure Laterality Date  . CHOLECYSTECTOMY    . KNEE ARTHROSCOPY Left 08/13/2017   Procedure: ARTHROSCOPY KNEE;  Surgeon: Dorna Leitz, MD;  Location: [place];  Service: Orthopedics;  Laterality: Left;  . TUBAL LIGATION      There were no vitals filed for this  visit.                      [facility] Adult PT Treatment/Exercise - 06/12/20 0001      Knee/Hip Exercises: Aerobic   Tread Mill 5% grade, 0.6 to 1.5 mph x 20 min   0.55 miles; 4/10 RPE     Knee/Hip Exercises: Machines for Strengthening   Cybex Knee Extension --    Cybex Knee Flexion 45# 3x10    Total Gym Leg Press DL 95# 3x10    Other Machine 25# 3x10 lat pull down; 20# 3x10 chest press      Shoulder Exercises: ROM/Strengthening   Lat Pull --    Lat Pull Limitations --                    PT Short Term Goals - 05/07/20 2214      PT SHORT TERM GOAL #1   Title Pt will have RPE of </= 3/10 after performing 10 squats with 10# weight    Baseline RPE of 5/10 after 10 squats with 10# weight    Time 3    Period Weeks    Status Partially Met    Target Date 04/05/20             PT Long Term Goals - 05/07/20 2214      PT LONG TERM GOAL #1   Title Pt will be independent with HEP  Baseline New    Time 6    Period Weeks    Status Revised    Target Date 06/18/20      PT LONG TERM GOAL #2   Title Pt will be able to walk 6 minutes on treadmill with 5% elevation at 1.3 mph with RPE </= 2/10    Baseline RPE 5/10    Time 6    Period Weeks    Status Revised    Target Date 06/18/20      PT LONG TERM GOAL #3   Title Pt will be able to tolerate ambulating 1 mile with RPE <2/10    Baseline Unable    Time 6    Period Weeks    Status Revised    Target Date 06/18/20      PT LONG TERM GOAL #4   Title Pt will have improved FOTO score to </= 40% impairment    Baseline 54% impairment    Time 6    Period Weeks    Status Revised    Target Date 06/18/20                 Plan - 06/12/20 1127    Clinical Impression Statement Pt able to tolerate treatment session well. Pt making strength gains. Very limited gains in regards to her endurance. Treatment focused on increasing amb time with less SHOB and progressing strengthening with machine weights.     Personal Factors and Comorbidities Fitness;Comorbidity 1;Time since onset of injury/illness/exacerbation    Comorbidities COVID    Examination-Activity Limitations Caring for Others;Hygiene/Grooming;Lift;Stairs;Locomotion Level    Examination-Participation Restrictions Estate agent;Shop;Yard Work    Stability/Clinical Decision Making Stable/Uncomplicated    Rehab Potential Good    PT Frequency 1x / week    PT Duration 6 weeks    PT Treatment/Interventions ADLs/Self Care Home Management;Cryotherapy;Electrical Stimulation;Iontophoresis 22m/ml Dexamethasone;Moist Heat;Gait training;Stair training;Functional mobility training;Therapeutic activities;Therapeutic exercise;Balance training;Neuromuscular re-education;Patient/family education;Manual techniques;Passive range of motion;Dry needling;Energy conservation;Taping    PT Next Visit Plan Continue whole body strengthening and endurance.    PT Home Exercise Plan Access Code [facility] Deadlift, forward T, squat    Consulted and Agree with Plan of Care Patient           Patient will benefit from skilled therapeutic intervention in order to improve the following deficits and impairments:  Difficulty walking, Decreased endurance, Decreased activity tolerance  Visit Diagnosis: Muscle weakness (generalized)  Difficulty in walking, not elsewhere classified  Other symptoms and signs involving the musculoskeletal system  History of COVID-19     Problem List Patient Active Problem List   Diagnosis Date Noted  . DOE (dyspnea on exertion) 04/23/2020  . Pulmonary nodules 04/23/2020  . Intractable migraine without aura and without status migrainosus 03/10/2020  . Palpitations 02/01/2020  . Dyspnea on exertion 02/01/2020  . History of COVID-19 02/01/2020  . Other fatigue 02/01/2020  . Physical deconditioning 02/01/2020  . Adjustment insomnia 12/11/2019  . Other chest pain 11/26/2019  . Extreme exhaustion 11/24/2019  .  Mixed hyperlipidemia 11/16/2019  . Migraine headache 10/02/2019  . 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 Obesity (BMI 30-39.9) 10/02/2019    Keltie Labell April Ma L Threasa Kinch PT, DPT 06/12/2020, 11:35 AM  [facility] [address]Phone: [phone]  Fax:  [phone] Name: GCLEOLA PERRYMANMRN: [id number] of Birth: [birth year]

## 2020-06-17 ENCOUNTER — Ambulatory Visit: Admitting: Physical Therapy

## 2020-06-17 ENCOUNTER — Encounter: Payer: Self-pay | Admitting: Physical Therapy

## 2020-06-17 ENCOUNTER — Other Ambulatory Visit: Payer: BC Managed Care – PPO

## 2020-06-17 ENCOUNTER — Other Ambulatory Visit: Payer: Self-pay

## 2020-06-17 ENCOUNTER — Inpatient Hospital Stay: Admission: RE | Admit: 2020-06-17 | Payer: BC Managed Care – PPO | Source: Ambulatory Visit

## 2020-06-17 DIAGNOSIS — M6281 Muscle weakness (generalized): Secondary | ICD-10-CM | POA: Diagnosis not present

## 2020-06-17 DIAGNOSIS — Z8616 Personal history of COVID-19: Secondary | ICD-10-CM

## 2020-06-17 DIAGNOSIS — R262 Difficulty in walking, not elsewhere classified: Secondary | ICD-10-CM

## 2020-06-17 DIAGNOSIS — R29898 Other symptoms and signs involving the musculoskeletal system: Secondary | ICD-10-CM

## 2020-06-17 NOTE — Therapy (Signed)
[place], [place], [address] Phone: [phone]   Fax:  [phone]  Physical Therapy Treatment and Discharge  Patient Details  Name: Taylor Ho MRN: [id number] Date of Birth: 1966-09-02 Referring Provider (PT): Fenton Foy, NP   Encounter Date: 06/17/2020   PT End of Session - 06/17/20 0911    Visit Number 10    Number of Visits 12    Date for PT Re-Evaluation 06/18/20    Authorization Type BCBS    PT Start Time 0915    PT Stop Time 1000    PT Time Calculation (min) 45 min    Activity Tolerance Patient tolerated treatment well    Behavior During Therapy [facility] for tasks assessed/performed           Past Medical History:  Diagnosis Date  . Acute respiratory failure with hypoxia ([place]) 02/01/2020  . Acute respiratory failure with hypoxia ([place]) 02/01/2020  . Adjustment insomnia 12/11/2019  . AKI (acute kidney injury) ([place]) 10/02/2019  . COVID-19   . Diarrhea due to COVID-19 10/02/2019  . Dyspnea on exertion 02/01/2020  . Elevated serum creatinine 12/11/2019  . Extreme exhaustion 11/24/2019  . Fatigue 02/01/2020  . Headache   . Heart palpitations 02/01/2020  . History of COVID-19 02/01/2020  . Hyperlipidemia   . Hypothyroidism   . Migraine headache 10/02/2019  . Mixed hyperlipidemia 11/16/2019  . Morbid (severe) obesity due to excess calories ([place])   . Obesity (BMI 30-39.9) 10/02/2019  . Other chest pain 11/26/2019  . Physical deconditioning 02/01/2020  . 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 Sore throat 12/11/2019  . Tension-type headache, not intractable 11/26/2019  . Tension-type headache, not intractable 11/26/2019    Past Surgical History:  Procedure Laterality Date  . CHOLECYSTECTOMY    . KNEE ARTHROSCOPY Left 08/13/2017   Procedure: ARTHROSCOPY KNEE;  Surgeon: Dorna Leitz, MD;  Location: [place];  Service: Orthopedics;  Laterality: Left;  . TUBAL LIGATION      There were no vitals filed for this  visit.   Subjective Assessment - 06/17/20 0916    Subjective Pt states things have been alright. Pt reports her foot will some days feel better and other days not. No other new complaints. Pt notes that she is supposed to go back to [facility] 10/7. Pt states she's feeling more fatigued today than last week.    Pertinent History COVID    Limitations Walking;House hold activities    Diagnostic tests CT and XR    Patient Stated Goals Improve endurance and activity tolerance for daily tasks and return to work    Currently in Pain? No/denies              [facility] PT Assessment - 06/17/20 0001      Observation/Other Assessments   Focus on Therapeutic Outcomes (FOTO)  46%      6 Minute Walk- Baseline   6 Minute Walk- Baseline yes    Modified Borg Scale for Dyspnea 0- Nothing at all      6 Minute walk- Post Test   6 Minute Walk Post Test yes    Modified Borg Scale for Dyspnea 3- Moderate shortness of breath or breathing difficulty      6 minute walk test results    Aerobic Endurance Distance Walked --   0.17 miles                        [facility] Adult  PT Treatment/Exercise - 06/17/20 0001      Knee/Hip Exercises: Aerobic   Tread Mill 5% grade, 0.6 to 1.5 mph x 10 min   3/10 RPE; .22 miles   Other Aerobic Farmer's carry 6lbs bilat x5 min; stairs with no handrail x 5 min   4/10 RPE with Farmer's carry; 5/10 RPE with steps     Knee/Hip Exercises: Standing   Other Standing Knee Exercises Squat x 10 with 10#s   2/10   Other Standing Knee Exercises Deadlift x 10 with 10#                  PT Education - 06/17/20 1008    Education Details Discussed goal setting, strengthening and endurance exercises to maintain and improve at home. Discussed self pacing and return to work.    Person(s) Educated Patient    Methods Explanation;Demonstration;Tactile cues;Verbal cues    Comprehension Verbalized understanding            PT Short Term Goals - 06/17/20 1007       PT SHORT TERM GOAL #1   Title Pt will have RPE of </= 3/10 after performing 10 squats with 10# weight    Baseline RPE of 5/10 after 10 squats with 10# weight    Time 3    Period Weeks    Status Achieved    Target Date 04/05/20             PT Long Term Goals - 06/17/20 1007      PT LONG TERM GOAL #1   Title Pt will be independent with HEP    Baseline New    Time 6    Period Weeks    Status Achieved      PT LONG TERM GOAL #2   Title Pt will be able to walk 6 minutes on treadmill with 5% elevation at 1.3 mph with RPE </= 2/10    Baseline RPE 3/10 on 9/20    Time 6    Period Weeks    Status Partially Met      PT LONG TERM GOAL #3   Title Pt will be able to tolerate ambulating 1 mile with RPE <2/10    Baseline Pt reports this is inconsistent @ home -- at times she is able; in clinic she has only been able to achieve 0.55 miles in 20 min    Time 6    Period Weeks    Status Partially Met      PT LONG TERM GOAL #4   Title Pt will have improved FOTO score to </= 40% impairment    Baseline 46% impairment '@9' /20    Time 6    Period Weeks    Status Partially Met                 Plan - 06/17/20 1010    Clinical Impression Statement Pt with reported more fatigue today; however, pt still has been able to demonstrate good improvements since initial eval. Pt has met her STG. Pt has partially met LTGs. Pt with good improvements in her strength. Pt's greatest defict remains to be her endurance -- she is inconsistent in her ability to tolerate ambulating a mile and still rates RPE at or above 3/10. Pt's FOTO score has improved to 46% as well. Pt is ready for PT d/c at this time with good home program to maintain strength and endurance.    Personal Factors and Comorbidities Fitness;Comorbidity 1;Time since onset of  injury/illness/exacerbation    Comorbidities COVID    Examination-Activity Limitations Caring for Others;Hygiene/Grooming;Lift;Stairs;Locomotion Level     Examination-Participation Restrictions Estate agent;Shop;Yard Work    Stability/Clinical Decision Making Stable/Uncomplicated    Rehab Potential Good    PT Frequency 1x / week    PT Duration 6 weeks    PT Treatment/Interventions ADLs/Self Care Home Management;Cryotherapy;Electrical Stimulation;Iontophoresis 27m/ml Dexamethasone;Moist Heat;Gait training;Stair training;Functional mobility training;Therapeutic activities;Therapeutic exercise;Balance training;Neuromuscular re-education;Patient/family education;Manual techniques;Passive range of motion;Dry needling;Energy conservation;Taping    PT Next Visit Plan Continue whole body strengthening and endurance.    PT Home Exercise Plan [facility] Code [facility]    Consulted and Agree with Plan of Care Patient           PHYSICAL THERAPY DISCHARGE SUMMARY  Visits from Start of Care: 10  Current functional level related to goals / functional outcomes: See above   Remaining deficits: Mostly endurance.   Education / Equipment: See above.  Plan: Patient agrees to discharge.  Patient goals were partially met. Patient is being discharged due to being pleased with the current functional level.  ?????       Patient will benefit from skilled therapeutic intervention in order to improve the following deficits and impairments:  Difficulty walking, Decreased endurance, Decreased activity tolerance  Visit Diagnosis: Muscle weakness (generalized)  Difficulty in walking, not elsewhere classified  Other symptoms and signs involving the musculoskeletal system  History of COVID-19     Problem List Patient Active Problem List   Diagnosis Date Noted  . DOE (dyspnea on exertion) 04/23/2020  . Pulmonary nodules 04/23/2020  . Intractable migraine without aura and without status migrainosus 03/10/2020  . Palpitations 02/01/2020  . Dyspnea on exertion 02/01/2020  . History of COVID-19 02/01/2020  . Other fatigue 02/01/2020   . Physical deconditioning 02/01/2020  . Adjustment insomnia 12/11/2019  . Other chest pain 11/26/2019  . Extreme exhaustion 11/24/2019  . Mixed hyperlipidemia 11/16/2019  . Migraine headache 10/02/2019  . 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 Obesity (BMI 30-39.9) 10/02/2019    Tamitha Norell April Ma L Haleigh Desmith PT, DPT 06/17/2020, 10:14 AM  [facility]144 [address]GW[place] [address]Phone: [phone]  Fax:  [phone] Name: GMACAIAH MANGALMRN: [id number] of Birth: 605/24/1967

## 2020-07-03 ENCOUNTER — Ambulatory Visit: Payer: BC Managed Care – PPO

## 2020-07-04 ENCOUNTER — Ambulatory Visit (INDEPENDENT_AMBULATORY_CARE_PROVIDER_SITE_OTHER): Payer: Self-pay | Admitting: Nurse Practitioner

## 2020-07-04 DIAGNOSIS — Z8616 Personal history of COVID-19: Secondary | ICD-10-CM

## 2020-07-04 NOTE — Progress Notes (Signed)
@Patient  ID: , female    DOB: April 28, 1966, 54 y.o.   MRN: [id number]  Chief Complaint  Patient presents with  . Follow-up    post covid shortness of breath    Referring provider: [id number], MD   54 year old female never smoker with history of migraines, hypothyroidism, obesity, hyperlipidemia. Diagnosed with covid December 2020.  RecentSignificantEncounters:   Timeline of illness:  09/23/20 Covid Test: positive  10/02/19 - 10/07/19 Hospital admission:Admitted to the hospital for Covid pneumonia and acute respiratory failure. Patient was treated with remdesivir, Decadron, IV steroids. She was discharged home on oxygen at 2 L..   2/18/21PCP follow-up: Patient complained of headache/migraine. Nadolol was increased to120 mg daily continued on Imitrex and continued on O2 at 2 L.  11/24/2019 PCP follow-up: Patient was continued on O2 at 2 L nasal cannula  12/08/2019 PCP follow-up: Patient complaining of migraine and started on Aimovig 70 mg monthly. Patient also complained of insomnia started on amitriptyline 25 mg at night.  01/03/2020 PCP follow-up: Follow-up for headache and insomnia-noted the amitriptyline was helping.  01/31/2020 PCP follow-up: Patient continued of ongoing severe fatigue and shortness of breath-referred to post Covid clinic.  02/01/20 Post Covid Care Center Visit: ordered follow up chest x ray, referred to pulmonary, referred to cardiology, ordered PT  02/23/20 Cardiology: Ordered echo, ordered Zio patch, ordered cardiac CT  03/05/20 Pulmonary: Ordered HRCT, PFT, continued on O2 as needed, trial Advair, continued albuterol    Imaging:  10/05/19 chest x 12/03/19 patchy opacities in both lungs with mixed interval changes, favoring pneumonia, although a component of pulmonary edema is not excluded given the borderline cardiomegaly.  02/01/20 Chest X ray: Borderline cardiomegaly with improved bibasilar aeration. Residual mild  atelectasis or developing scar after COVID 19 pneumonia at both lung bases.  03/14/20 HRCT: There are findings in the lungs which could suggest very mild interstitial lung disease, with a spectrum of findings considered most compatible with an alternative diagnosis to usual interstitial pneumonia (UIP) per current ATS guidelines. If there is persistent clinical concern for interstitial lung disease, repeat high-resolution chest CT could be considered in 12 months to assess for temporal changes in the appearance of the lung parenchyma. Multiple pulmonary nodules scattered throughout the lungs bilaterally, largest of which is in the medial aspect of the right lower lobe measuring 1.1 x 0.9 x 0.8 cm. Non-contrast chest CT at 3-6 months is recommended. If the nodules are stable at time of repeat CT, then future CT at 18-24 months   03/19/20 Cardiac CT: Coronary calcium score of 0. This was 0 percentile for age and sex matched control. Normal coronary origin with right dominance. No evidence of CAD. CAD-RADS=0.  HPI  Patient presents today for post COVID care clinic visit follow-up.  She has been participating in physical therapy.  Her last note from physical therapy states that she is improving.  Patient states that she feels like maybe she is 50% better at this point.  She is very frustrated that she is not back to 100% yet.  She has followed up with pulmonary.  Her last CT scan showed possible pulmonary fibrosis.  Patient will need repeat imaging in the next 3 months.  She has followed up with cardiology and overall all cardiac test were normal.  She is trying to stay as active as she can.  She has not returned to work.  She states that she does not think she could tolerate being at work for full  day. Denies f/c/s, n/v/d, hemoptysis, PND, chest pain or edema.        No Known Allergies  Immunization History  Administered Date(s) Administered  . Influenza-Unspecified 07/07/2019  . PFIZER SARS-COV-2  Vaccination 11/10/2019, 12/01/2019    Past Medical History:  Diagnosis Date  . Acute respiratory failure with hypoxia (HCC) 02/01/2020  . Acute respiratory failure with hypoxia (HCC) 02/01/2020  . Adjustment insomnia 12/11/2019  . AKI (acute kidney injury) (HCC) 10/02/2019  . COVID-19   . Diarrhea due to COVID-19 10/02/2019  . Dyspnea on exertion 02/01/2020  . Elevated serum creatinine 12/11/2019  . Extreme exhaustion 11/24/2019  . Fatigue 02/01/2020  . Headache   . Heart palpitations 02/01/2020  . History of COVID-19 02/01/2020  . Hyperlipidemia   . Hypothyroidism   . Migraine headache 10/02/2019  . Mixed hyperlipidemia 11/16/2019  . Morbid (severe) obesity due to excess calories (HCC)   . Obesity (BMI 30-39.9) 10/02/2019  . Other chest pain 11/26/2019  . Physical deconditioning 02/01/2020  . 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 Sore throat 12/11/2019  . Tension-type headache, not intractable 11/26/2019  . Tension-type headache, not intractable 11/26/2019    Tobacco History: Social History   Tobacco Use  Smoking Status Never Smoker  Smokeless Tobacco Never Used   Counseling given: Yes   Outpatient Encounter Medications as of 07/04/2020  Medication Sig  . albuterol (VENTOLIN HFA) 108 (90 Base) MCG/ACT inhaler Inhale 2 puffs into the lungs every 6 (six) hours as needed for wheezing or shortness of breath.  Marland Kitchen amitriptyline (ELAVIL) 25 MG tablet Take 1 tablet (25 mg total) by mouth at bedtime.  [name] (AIMOVIG) 140 MG/ML SOAJ Inject 140 mg into the skin every 30 (thirty) days.  Janann August 112 MCG tablet Take 1 tablet by mouth once daily  . Fluticasone-Salmeterol (ADVAIR DISKUS) 250-50 MCG/DOSE AEPB Inhale 1 puff into the lungs 2 (two) times daily.  . meloxicam (MOBIC) 7.5 MG tablet Take 1 tablet by mouth once daily  . metoprolol tartrate (LOPRESSOR) 100 MG tablet Take one tablet two hours prior to CT procedure.  . nadolol (CORGARD) 80 MG tablet Take 2 tablets by mouth once daily  .  Rimegepant Sulfate (NURTEC) 75 MG TBDP Take at onset of migraine/aura. May NOT repeat dosing for 24 hrs.  . rosuvastatin (CRESTOR) 20 MG tablet Take 1 tablet (20 mg total) by mouth daily.  . SUMAtriptan (IMITREX) 100 MG tablet TAKE 1 TABLET BY MOUTH AT ONSET OF HEADACHE, MAY REPEAT IN 2 HOURS IF NEEDED  . Vitamin D, Ergocalciferol, (DRISDOL) 1.25 MG (50000 UNIT) CAPS capsule Take 1 capsule (50,000 Units total) by mouth every 7 (seven) days.   No facility-administered encounter medications on file as of 07/04/2020.     Review of Systems  Review of Systems  Constitutional: Positive for fatigue. Negative for fever.  HENT: Negative.   Respiratory: Positive for shortness of breath. Negative for cough.   Cardiovascular: Negative.  Negative for chest pain, palpitations and leg swelling.  Gastrointestinal: Negative.   Allergic/Immunologic: Negative.   Neurological: Negative.   Psychiatric/Behavioral: Negative.        Physical Exam  Pulse 75   Temp 97.8 F (36.6 C)   SpO2 98% Comment: RA  Wt Readings from Last 5 Encounters:  04/23/20 (!) 234 lb 6.4 oz (106.3 kg)  03/19/20 231 lb (104.8 kg)  03/05/20 231 lb (104.8 kg)  03/04/20 232 lb 3.2 oz (105.3 kg)  02/23/20 232 lb (105.2 kg)     Physical Exam  Vitals and nursing note reviewed.  Constitutional:      General: She is not in acute distress.    Appearance: She is well-developed.  Cardiovascular:     Rate and Rhythm: Normal rate and regular rhythm.  Pulmonary:     Effort: Pulmonary effort is normal.     Breath sounds: Normal breath sounds.  Musculoskeletal:     Right lower leg: No edema.     Left lower leg: No edema.  Neurological:     Mental Status: She is alert and oriented to person, place, and time.  Psychiatric:        Mood and Affect: Mood normal.        Behavior: Behavior normal.        Assessment & Plan:   History of COVID-19 History of Covid Pneumonia Acute Respiratory failure:   Follow up with  pulmonary  Continue albuterol as needed  Continue Advair  Continue to keep close check on O2 sats and use O2 as needed to keep sats above 90%  Deep breathing exercises  Stay active   Fatigue Deconditioning:  Continue PT  May start gentle flow yoga     Follow up in 3 months or sooner if needed       Ivonne Andrew, NP 07/04/2020

## 2020-07-04 NOTE — Patient Instructions (Signed)
History of COVID-19 History of Covid Pneumonia Acute Respiratory failure:   Follow up with pulmonary  Continue albuterol as needed  Continue Advair  Continue to keep close check on O2 sats and use O2 as needed to keep sats above 90%  Deep breathing exercises  Stay active   Fatigue Deconditioning:  Continue PT  May start gentle flow yoga     Follow up in 3 months or sooner if needed

## 2020-07-04 NOTE — Assessment & Plan Note (Signed)
History of Covid Pneumonia Acute Respiratory failure:   Follow up with pulmonary  Continue albuterol as needed  Continue Advair  Continue to keep close check on O2 sats and use O2 as needed to keep sats above 90%  Deep breathing exercises  Stay active   Fatigue Deconditioning:  Continue PT  May start gentle flow yoga     Follow up in 3 months or sooner if needed

## 2020-07-05 ENCOUNTER — Other Ambulatory Visit: Payer: Self-pay | Admitting: Family Medicine

## 2020-07-05 DIAGNOSIS — G43019 Migraine without aura, intractable, without status migrainosus: Secondary | ICD-10-CM

## 2020-07-07 NOTE — Telephone Encounter (Signed)
Please call pt as she seems to have two beta blockers. Metoprolol and nadolol. Please confirm what she is taking.

## 2020-07-09 ENCOUNTER — Other Ambulatory Visit: Payer: Self-pay

## 2020-07-09 DIAGNOSIS — G43019 Migraine without aura, intractable, without status migrainosus: Secondary | ICD-10-CM

## 2020-07-09 MED ORDER — VITAMIN D (ERGOCALCIFEROL) 1.25 MG (50000 UNIT) PO CAPS: 50000.0000 [IU] | ORAL_CAPSULE | ORAL | 0 refills | Status: DC

## 2020-07-09 MED ORDER — AMITRIPTYLINE HCL 25 MG PO TABS: 25.0000 mg | ORAL_TABLET | Freq: Every day | ORAL | 0 refills | Status: AC

## 2020-07-25 ENCOUNTER — Other Ambulatory Visit: Payer: Self-pay

## 2020-07-25 DIAGNOSIS — G43019 Migraine without aura, intractable, without status migrainosus: Secondary | ICD-10-CM

## 2020-07-26 MED ORDER — LEVOTHYROXINE SODIUM 112 MCG PO TABS: 112.0000 ug | ORAL_TABLET | Freq: Every day | ORAL | 0 refills | Status: DC

## 2020-07-26 MED ORDER — NADOLOL 80 MG PO TABS: 160.0000 mg | ORAL_TABLET | Freq: Every day | ORAL | 0 refills | Status: DC

## 2020-07-26 NOTE — Telephone Encounter (Signed)
Please ask pt to call and set up follow up appointment. Thank you, Dr. Sedalia Muta

## 2020-09-19 ENCOUNTER — Other Ambulatory Visit: Payer: Self-pay | Admitting: Family Medicine

## 2020-09-19 ENCOUNTER — Other Ambulatory Visit: Payer: Self-pay | Admitting: Physician Assistant

## 2020-09-19 DIAGNOSIS — G43019 Migraine without aura, intractable, without status migrainosus: Secondary | ICD-10-CM

## 2020-10-04 ENCOUNTER — Ambulatory Visit: Payer: Self-pay

## 2020-10-25 ENCOUNTER — Other Ambulatory Visit: Payer: Self-pay | Admitting: Family Medicine

## 2020-10-25 DIAGNOSIS — G43019 Migraine without aura, intractable, without status migrainosus: Secondary | ICD-10-CM

## 2020-11-08 ENCOUNTER — Other Ambulatory Visit: Payer: Self-pay | Admitting: Family Medicine

## 2020-11-08 DIAGNOSIS — G43019 Migraine without aura, intractable, without status migrainosus: Secondary | ICD-10-CM

## 2020-11-11 ENCOUNTER — Other Ambulatory Visit: Payer: Self-pay

## 2020-11-11 DIAGNOSIS — G43019 Migraine without aura, intractable, without status migrainosus: Secondary | ICD-10-CM

## 2020-11-15 ENCOUNTER — Other Ambulatory Visit: Payer: Self-pay | Admitting: Family Medicine

## 2020-11-15 DIAGNOSIS — G43019 Migraine without aura, intractable, without status migrainosus: Secondary | ICD-10-CM

## 2020-11-22 ENCOUNTER — Other Ambulatory Visit: Payer: Self-pay | Admitting: Family Medicine

## 2020-11-22 DIAGNOSIS — G43019 Migraine without aura, intractable, without status migrainosus: Secondary | ICD-10-CM

## 2020-12-09 ENCOUNTER — Other Ambulatory Visit: Payer: Self-pay | Admitting: Family Medicine

## 2020-12-09 ENCOUNTER — Telehealth: Payer: Self-pay

## 2020-12-09 DIAGNOSIS — G43019 Migraine without aura, intractable, without status migrainosus: Secondary | ICD-10-CM

## 2020-12-09 MED ORDER — NADOLOL 80 MG PO TABS: 160.0000 mg | ORAL_TABLET | Freq: Every day | ORAL | 0 refills | Status: AC

## 2020-12-09 MED ORDER — VITAMIN D (ERGOCALCIFEROL) 1.25 MG (50000 UNIT) PO CAPS: 50000.0000 [IU] | ORAL_CAPSULE | ORAL | 0 refills | Status: AC

## 2020-12-09 MED ORDER — MELOXICAM 7.5 MG PO TABS
7.5000 mg | ORAL_TABLET | Freq: Every day | ORAL | 0 refills | Status: AC
Start: 2020-12-09 — End: ?

## 2020-12-09 MED ORDER — AIMOVIG 140 MG/ML ~~LOC~~ SOAJ: 140.0000 mg | SUBCUTANEOUS | 0 refills | Status: AC

## 2020-12-09 MED ORDER — SUMATRIPTAN SUCCINATE 100 MG PO TABS: ORAL_TABLET | ORAL | 0 refills | Status: AC

## 2020-12-09 MED ORDER — NURTEC 75 MG PO TBDP: ORAL_TABLET | ORAL | 0 refills | Status: AC

## 2020-12-09 MED ORDER — FLUTICASONE-SALMETEROL 250-50 MCG/DOSE IN AEPB
1.0000 | INHALATION_SPRAY | Freq: Two times a day (BID) | RESPIRATORY_TRACT | 0 refills | Status: AC
Start: 2020-12-09 — End: ?

## 2020-12-09 MED ORDER — LEVOTHYROXINE SODIUM 112 MCG PO TABS: 112.0000 ug | ORAL_TABLET | Freq: Every day | ORAL | 0 refills | Status: AC

## 2020-12-09 MED ORDER — ROSUVASTATIN CALCIUM 20 MG PO TABS: 20.0000 mg | ORAL_TABLET | Freq: Every day | ORAL | 0 refills | Status: AC

## 2020-12-09 NOTE — Telephone Encounter (Signed)
Sent 1 month rx. Kc

## 2020-12-09 NOTE — Telephone Encounter (Signed)
Patient left message requesting a refill on her rx's since she is completely out. States she has appt 12/16/2020.

## 2020-12-12 NOTE — Progress Notes (Deleted)
Subjective:  Patient ID: Taylor Ho, female    DOB: 12-Mar-1966  Age: 55 y.o. MRN: [id number]  Chief Complaint  Patient presents with  . Hyperlipidemia  . Hypothyroidism    HPI Mixed hyperlipidemia Taking Nadolol, cestor Acquired hypothyroidism Takes Synthroid Migraines Controlled with Amitriptyline, Aimovig, Nurtec, imitrex   Current Outpatient Medications on File Prior to Visit  Medication Sig Dispense Refill  . albuterol (VENTOLIN HFA) 108 (90 Base) MCG/ACT inhaler Inhale 2 puffs into the lungs every 6 (six) hours as needed for wheezing or shortness of breath. 6.7 g 1  . amitriptyline (ELAVIL) 25 MG tablet Take 1 tablet (25 mg total) by mouth at bedtime. 90 tablet 0  . Erenumab-aooe (AIMOVIG) 140 MG/ML SOAJ Inject 140 mg into the skin every 30 (thirty) days. 1 mL 0  . Fluticasone-Salmeterol (ADVAIR DISKUS) 250-50 MCG/DOSE AEPB Inhale 1 puff into the lungs 2 (two) times daily. 60 each 0  . levothyroxine (EUTHYROX) 112 MCG tablet Take 1 tablet (112 mcg total) by mouth daily. 30 tablet 0  . meloxicam (MOBIC) 7.5 MG tablet Take 1 tablet (7.5 mg total) by mouth daily. 30 tablet 0  . nadolol (CORGARD) 80 MG tablet Take 2 tablets (160 mg total) by mouth daily. 60 tablet 0  . Rimegepant Sulfate (NURTEC) 75 MG TBDP Take at onset of migraine/aura. May NOT repeat dosing for 24 hrs. 10 tablet 0  . rosuvastatin (CRESTOR) 20 MG tablet Take 1 tablet (20 mg total) by mouth daily. 30 tablet 0  . SUMAtriptan (IMITREX) 100 MG tablet TAKE 1 TABLET BY MOUTH AT ONSET OF HEADACHE, MAY REPEAT IN 2 HOURS IF NEEDED 36 tablet 0  . Vitamin D, Ergocalciferol, (DRISDOL) 1.25 MG (50000 UNIT) CAPS capsule Take 1 capsule (50,000 Units total) by mouth once a week. 4 capsule 0   No current facility-administered medications on file prior to visit.   Past Medical History:  Diagnosis Date  . Acute respiratory failure with hypoxia (HCC) 02/01/2020  . Acute respiratory failure with hypoxia (HCC) 02/01/2020  .  Adjustment insomnia 12/11/2019  . AKI (acute kidney injury) (HCC) 10/02/2019  . COVID-19   . Diarrhea due to COVID-19 10/02/2019  . Dyspnea on exertion 02/01/2020  . Elevated serum creatinine 12/11/2019  . Extreme exhaustion 11/24/2019  . Fatigue 02/01/2020  . Headache   . Heart palpitations 02/01/2020  . History of COVID-19 02/01/2020  . Hyperlipidemia   . Hypothyroidism   . Migraine headache 10/02/2019  . Mixed hyperlipidemia 11/16/2019  . Morbid (severe) obesity due to excess calories (HCC)   . Obesity (BMI 30-39.9) 10/02/2019  . Other chest pain 11/26/2019  . Physical deconditioning 02/01/2020  . 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 Sore throat 12/11/2019  . Tension-type headache, not intractable 11/26/2019  . Tension-type headache, not intractable 11/26/2019   Past Surgical History:  Procedure Laterality Date  . CHOLECYSTECTOMY    . KNEE ARTHROSCOPY Left 08/13/2017   Procedure: ARTHROSCOPY KNEE;  Surgeon: Jodi Geralds, MD;  Location: [facility] OR;  Service: Orthopedics;  Laterality: Left;  . TUBAL LIGATION      Family History  Problem Relation Age of Onset  . Alzheimer's disease Mother   . Alzheimer's disease Father   . Colon cancer Father   . Colon cancer Sister   . Stroke Brother   . Colitis Sister   . Irritable bowel syndrome Sister    Social History   Socioeconomic History  . Marital status: Married    Spouse name: Not on file  .  Number of children: 4  . Years of education: Not on file  . Highest education level: Not on file  Occupational History  . Occupation: Nurse  Tobacco Use  . Smoking status: Never Smoker  . Smokeless tobacco: Never Used  Vaping Use  . Vaping Use: Never used  Substance and Sexual Activity  . Alcohol use: No  . Drug use: Never  . Sexual activity: Not on file  Other Topics Concern  . Not on file  Social History Narrative  . Not on file   Social Determinants of Health   Financial Resource Strain: Not on file  Food Insecurity: Not on file   Transportation Needs: Not on file  Physical Activity: Not on file  Stress: Not on file  Social Connections: Not on file    Review of Systems  Constitutional: Negative for chills, fatigue and fever.  HENT: Negative for congestion, ear pain, rhinorrhea and sore throat.   Respiratory: Negative for cough and shortness of breath.   Cardiovascular: Negative for chest pain.  Gastrointestinal: Negative for abdominal pain, constipation, diarrhea, nausea and vomiting.  Genitourinary: Negative for dysuria and urgency.  Musculoskeletal: Negative for back pain and myalgias.  Neurological: Negative for dizziness, weakness, light-headedness and headaches.  Psychiatric/Behavioral: Negative for dysphoric mood. The patient is not nervous/anxious.      Objective:  There were no vitals taken for this visit.  BP/Weight 04/23/2020 03/19/2020 03/05/2020  Systolic BP 124 123 124  Diastolic BP 80 81 80  Wt. (Lbs) 234.4 231 231  BMI 37.83 37.28 37.28    Physical Exam Vitals reviewed.  Constitutional:      Appearance: Normal appearance. She is normal weight.  Neck:     Vascular: No carotid bruit.  Cardiovascular:     Rate and Rhythm: Normal rate and regular rhythm.     Pulses: Normal pulses.     Heart sounds: Normal heart sounds.  Pulmonary:     Effort: Pulmonary effort is normal. No respiratory distress.     Breath sounds: Normal breath sounds.  Abdominal:     General: Abdomen is flat. Bowel sounds are normal.     Palpations: Abdomen is soft.     Tenderness: There is no abdominal tenderness.  Neurological:     Mental Status: She is alert and oriented to person, place, and time.  Psychiatric:        Mood and Affect: Mood normal.        Behavior: Behavior normal.     Diabetic Foot Exam - Simple   No data filed      Lab Results  Component Value Date   WBC 7.4 02/01/2020   HGB 15.1 02/01/2020   HCT 44.7 02/01/2020   PLT 346 02/01/2020   GLUCOSE 93 03/19/2020   CHOL 282 (H)  03/04/2020   TRIG 196 (H) 03/04/2020   HDL 45 03/04/2020   LDLCALC 199 (H) 03/04/2020   ALT 19 02/01/2020   AST 25 02/01/2020   NA 141 03/19/2020   K 4.4 03/19/2020   CL 108 03/19/2020   CREATININE 1.36 (H) 03/19/2020   BUN 20 03/19/2020   CO2 25 03/19/2020   TSH 1.540 11/24/2019   HGBA1C 6.0 (H) 10/04/2019      Assessment & Plan:   1. Mixed hyperlipidemia  2. Acquired hypothyroidism    No orders of the defined types were placed in this encounter.   No orders of the defined types were placed in this encounter.     I spent <  time > minutes dedicated to the care of this patient on the date of this encounter to include face-to-face time with the patient, as well as: ***  Follow-up: No follow-ups on file.  An After Visit Summary was printed and given to the patient.  Blane Ohara, MD [facility] [phone]

## 2020-12-16 ENCOUNTER — Ambulatory Visit (INDEPENDENT_AMBULATORY_CARE_PROVIDER_SITE_OTHER): Payer: Self-pay | Admitting: Family Medicine

## 2020-12-16 DIAGNOSIS — E039 Hypothyroidism, unspecified: Secondary | ICD-10-CM

## 2020-12-16 DIAGNOSIS — E782 Mixed hyperlipidemia: Secondary | ICD-10-CM

## 2020-12-28 NOTE — Progress Notes (Signed)
No showed. kc 

## 2020-12-31 DIAGNOSIS — E538 Deficiency of other specified B group vitamins: Secondary | ICD-10-CM | POA: Diagnosis not present

## 2020-12-31 DIAGNOSIS — E559 Vitamin D deficiency, unspecified: Secondary | ICD-10-CM | POA: Diagnosis not present

## 2020-12-31 DIAGNOSIS — Z0000 Encounter for general adult medical examination without abnormal findings: Secondary | ICD-10-CM | POA: Diagnosis not present

## 2021-02-17 DIAGNOSIS — Z20822 Contact with and (suspected) exposure to covid-19: Secondary | ICD-10-CM | POA: Diagnosis not present

## 2021-02-17 DIAGNOSIS — R509 Fever, unspecified: Secondary | ICD-10-CM | POA: Diagnosis not present

## 2021-02-18 DIAGNOSIS — J209 Acute bronchitis, unspecified: Secondary | ICD-10-CM | POA: Diagnosis not present

## 2021-02-18 DIAGNOSIS — Z6839 Body mass index (BMI) 39.0-39.9, adult: Secondary | ICD-10-CM | POA: Diagnosis not present

## 2021-02-18 DIAGNOSIS — J019 Acute sinusitis, unspecified: Secondary | ICD-10-CM | POA: Diagnosis not present

## 2021-02-18 DIAGNOSIS — E669 Obesity, unspecified: Secondary | ICD-10-CM | POA: Diagnosis not present

## 2021-02-19 ENCOUNTER — Ambulatory Visit: Payer: Self-pay

## 2021-02-25 DIAGNOSIS — J309 Allergic rhinitis, unspecified: Secondary | ICD-10-CM | POA: Diagnosis not present

## 2021-02-25 DIAGNOSIS — Z6839 Body mass index (BMI) 39.0-39.9, adult: Secondary | ICD-10-CM | POA: Diagnosis not present

## 2021-02-25 DIAGNOSIS — J209 Acute bronchitis, unspecified: Secondary | ICD-10-CM | POA: Diagnosis not present

## 2021-02-25 DIAGNOSIS — E669 Obesity, unspecified: Secondary | ICD-10-CM | POA: Diagnosis not present

## 2021-02-26 DIAGNOSIS — R059 Cough, unspecified: Secondary | ICD-10-CM | POA: Diagnosis not present

## 2021-02-26 DIAGNOSIS — J209 Acute bronchitis, unspecified: Secondary | ICD-10-CM | POA: Diagnosis not present

## 2021-03-04 DIAGNOSIS — L988 Other specified disorders of the skin and subcutaneous tissue: Secondary | ICD-10-CM | POA: Diagnosis not present

## 2021-03-04 DIAGNOSIS — D485 Neoplasm of uncertain behavior of skin: Secondary | ICD-10-CM | POA: Diagnosis not present

## 2021-04-15 DIAGNOSIS — I10 Essential (primary) hypertension: Secondary | ICD-10-CM | POA: Diagnosis not present

## 2021-04-15 DIAGNOSIS — E559 Vitamin D deficiency, unspecified: Secondary | ICD-10-CM | POA: Diagnosis not present

## 2021-04-15 DIAGNOSIS — E039 Hypothyroidism, unspecified: Secondary | ICD-10-CM | POA: Diagnosis not present

## 2021-04-15 DIAGNOSIS — E538 Deficiency of other specified B group vitamins: Secondary | ICD-10-CM | POA: Diagnosis not present

## 2021-06-09 IMAGING — CT CT CHEST HIGH RESOLUTION W/O CM
2 of 7 series · 14 of 36 positions shown, 17 images · non-contrast
Comparison: No priors.

CLINICAL DATA: 53-year-old female with history of dyspnea on
exertion. Prior history of [id number] pneumonia in August 2019.

EXAM:
CT CHEST WITHOUT CONTRAST
TECHNIQUE: Multidetector CT imaging of the chest was performed following the
standard protocol without intravenous contrast. High resolution
imaging of the lungs, as well as inspiratory and expiratory imaging,
was performed.

[Series 4: high resolution · axial · 0.68mm/px · z∈[-304,-74]mm · 11 of 276 slices shown, 14 images]
[im 23/276  mediastinal]
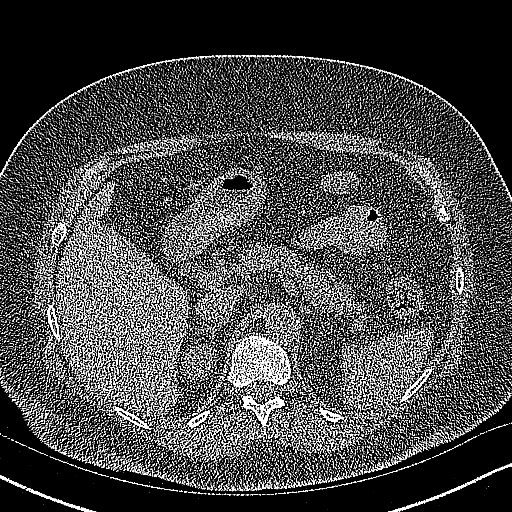
[im 23/276  lung]
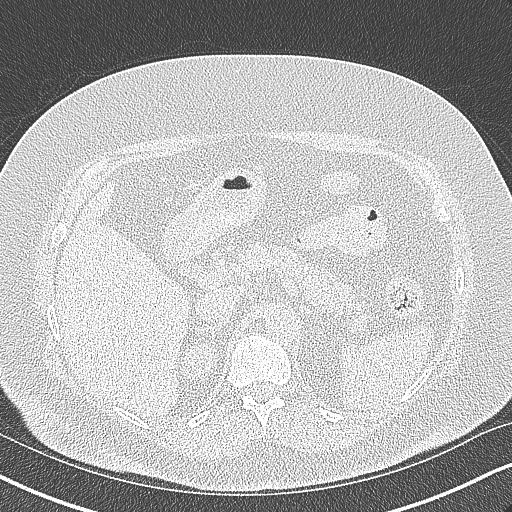
[im 46/276  lung]
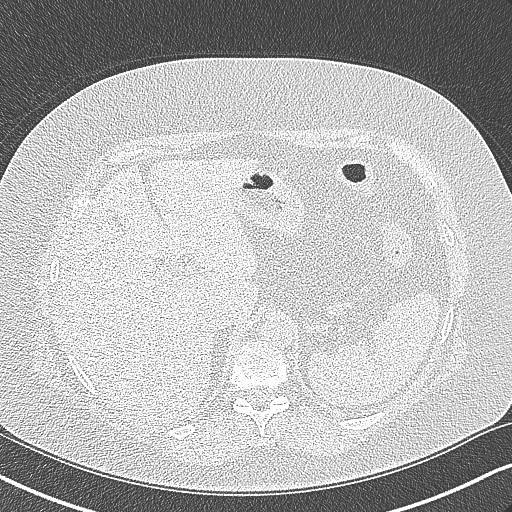
[im 69/276  lung]
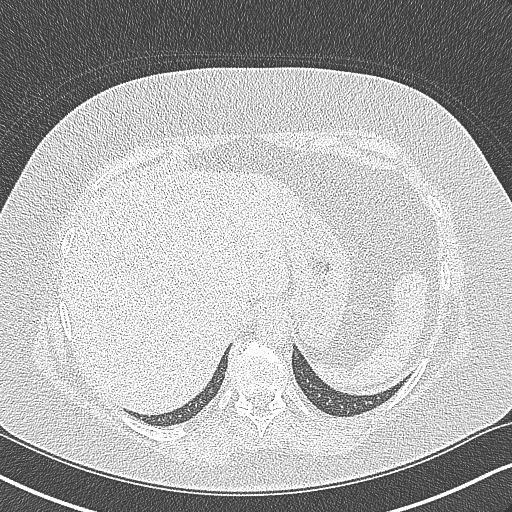
[im 92/276  lung]
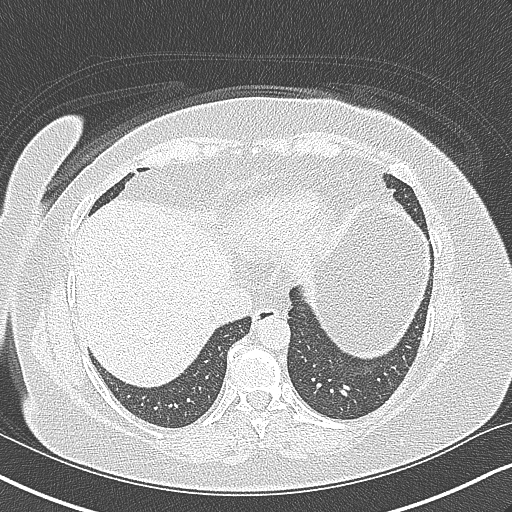
[im 115/276  mediastinal]
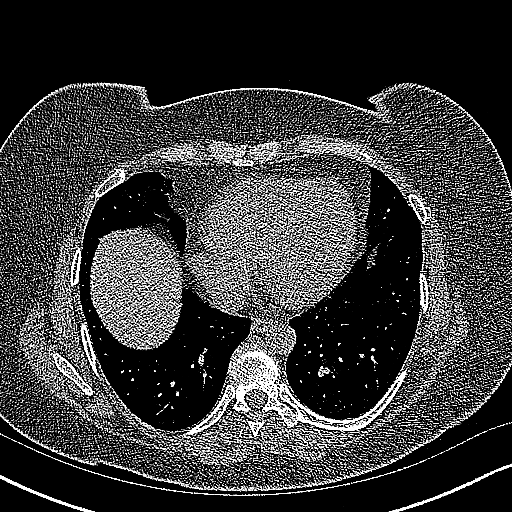
[im 115/276  lung]
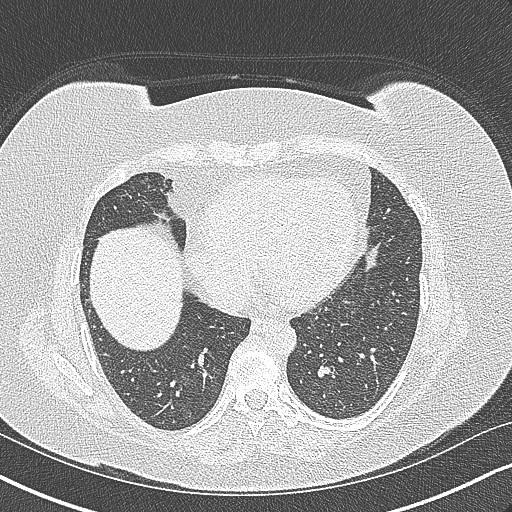
[im 138/276  lung]
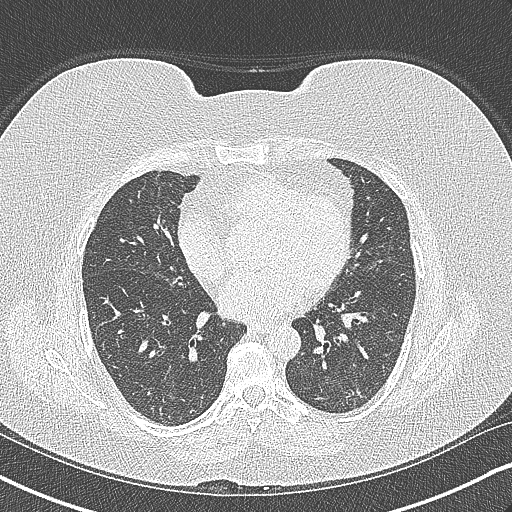
[im 161/276  lung]
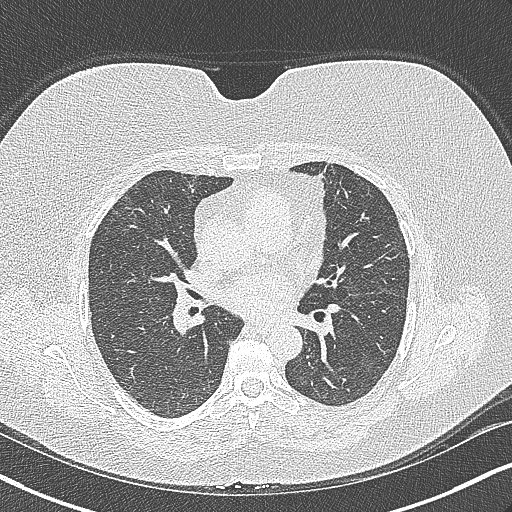
[im 184/276  lung]
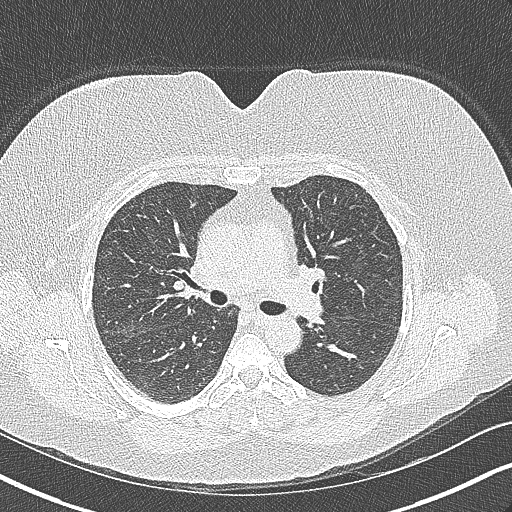
[im 207/276  mediastinal]
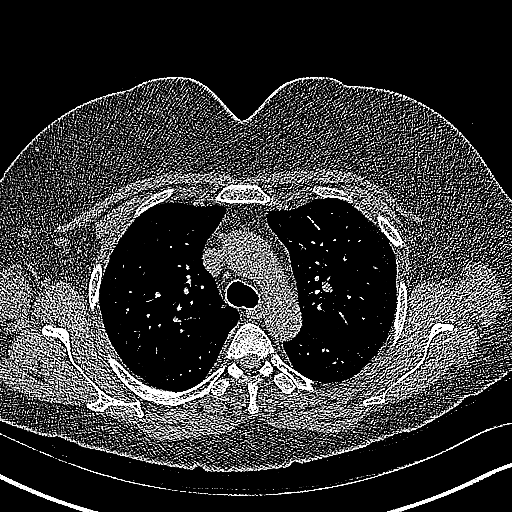
[im 207/276  lung]
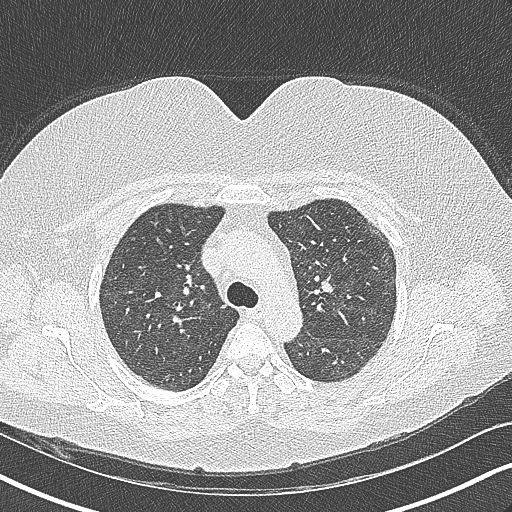
[im 230/276  lung]
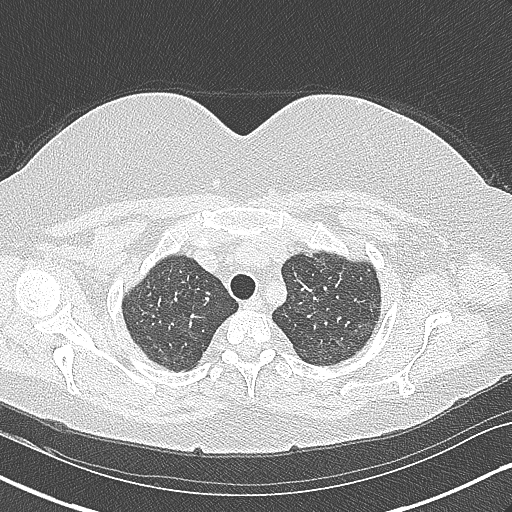
[im 253/276  lung]
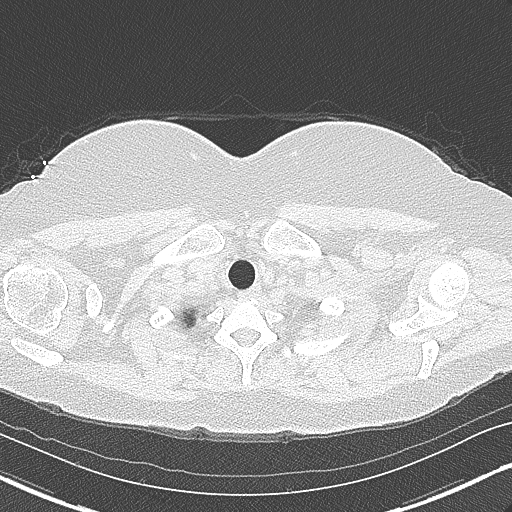

[Series 6: coronal · coronal · 0.59mm/px · 3 of 118 slices shown]
[im 24/118  lung]
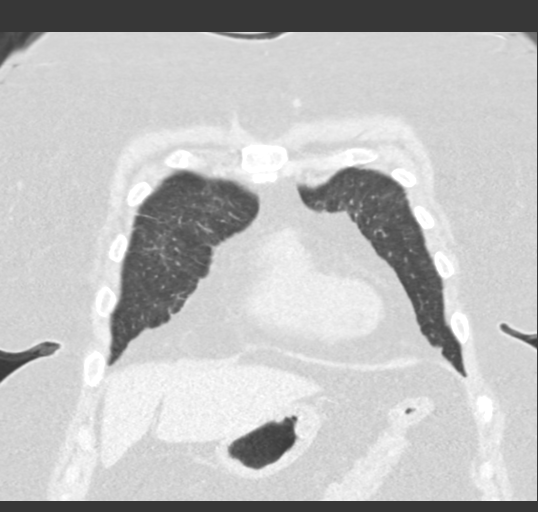
[im 47/118  lung]
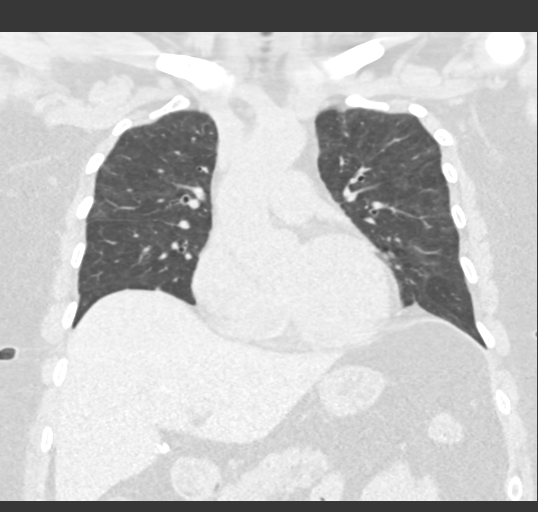
[im 71/118  lung]
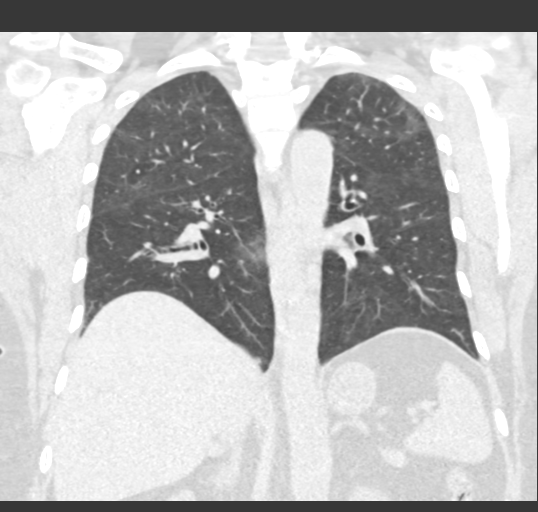

[14 of 36 positions shown; findings below may reference images not displayed]

FINDINGS: Cardiovascular: Heart size is normal. There is no significant
pericardial fluid, thickening or pericardial calcification. No
atherosclerotic calcifications in the thoracic aorta or the coronary
arteries.

Mediastinum/Nodes: No pathologically enlarged mediastinal or hilar
lymph nodes. Please note that accurate exclusion of hilar adenopathy
is limited on noncontrast CT scans. Esophagus is unremarkable in
appearance. No axillary lymphadenopathy.

Lungs/Pleura: High-resolution images demonstrate widespread but very
mild ground-glass attenuation and some patchy mild septal thickening
throughout the lungs bilaterally. There is no discernible
craniocaudal gradient. No traction bronchiectasis or honeycombing.
Inspiratory and expiratory imaging is unremarkable. Multiple
pulmonary nodules are scattered throughout the lungs bilaterally,
most of which are 5 mm or less in size. The largest of these nodules
is in the medial aspect of the right lower lobe (axial image 64 of
series 3 and coronal image 77 of series 6) measuring 1.1 x 0.9 x
cm. No confluent consolidative airspace disease. No pleural
effusions.

Upper Abdomen: Status post cholecystectomy.

Musculoskeletal: There are no aggressive appearing lytic or blastic
lesions noted in the visualized portions of the skeleton.
IMPRESSION: 1. There are findings in the lungs which could suggest very mild
interstitial lung disease, with a spectrum of findings considered
most compatible with an alternative diagnosis to usual interstitial
pneumonia (UIP) per current ATS guidelines. If there is persistent
clinical concern for interstitial lung disease, repeat
high-resolution chest CT could be considered in 12 months to assess
for temporal changes in the appearance of the lung parenchyma.
2. Multiple pulmonary nodules scattered throughout the lungs
bilaterally, largest of which is in the medial aspect of the right
lower lobe measuring 1.1 x 0.9 x 0.8 cm. Non-contrast chest CT at
3-6 months is recommended. If the nodules are stable at time of
repeat CT, then future CT at 18-24 months (from today's scan) is
considered optional for low-risk patients, but is recommended for
high-risk patients. This recommendation follows the consensus
statement: Guidelines for Management of Incidental Pulmonary Nodules
Detected on CT Images: From the [HOSPITAL] 5107; Radiology

## 2021-06-14 IMAGING — CT CT HEART MORP W/ CTA COR W/ SCORE W/ CA W/CM &/OR W/O CM
4 of 7 series · 8 of 20 positions shown, 9 images · IV contrast (APPLIED)
Comparison: None.
COMPARISON: None.

Addendum:
EXAM:
OVER-READ INTERPRETATION  CT CHEST

The following report is an over-read performed by radiologist Dr.
Truc Arciniega [REDACTED] on 03/19/2020. This
over-read does not include interpretation of cardiac or coronary
anatomy or pathology. The coronary calcium score/coronary CTA
interpretation by the cardiologist is attached.
TECHNIQUE: The patient was scanned on a [employer] scanner.

[Series 6: best diast 69 % · axial · 0.38mm/px · z∈[+1193,+1236]mm · 2 of 321 slices shown, 3 images]
[im 107/321  vessel]
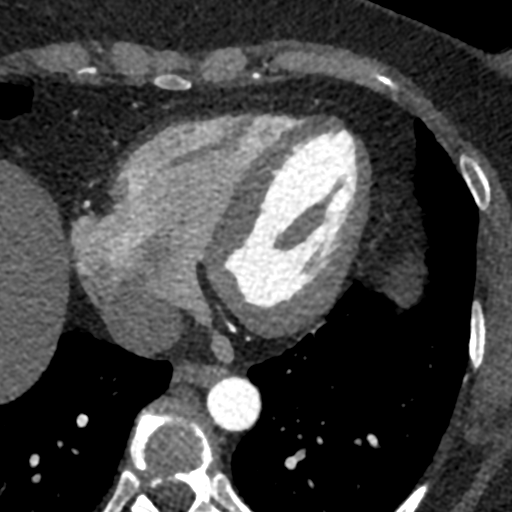
[im 107/321  lung]
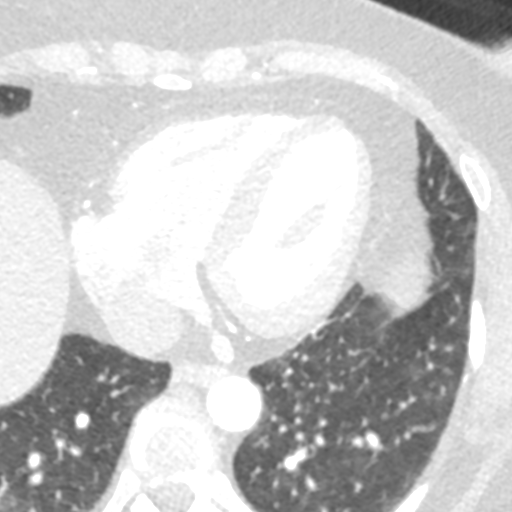
[im 214/321  vessel]
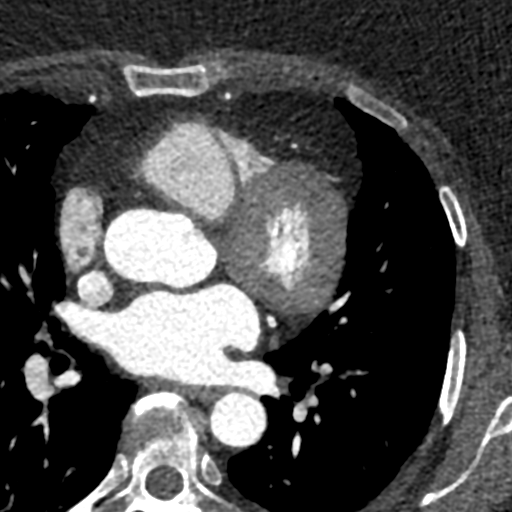

[Series 7: best syst 43 % · axial · 0.38mm/px · z∈[+1193,+1236]mm · 2 of 321 slices shown]
[im 107/321  vessel]
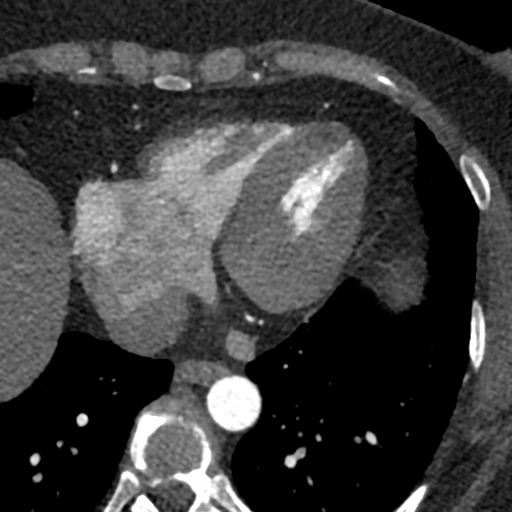
[im 214/321  vessel]
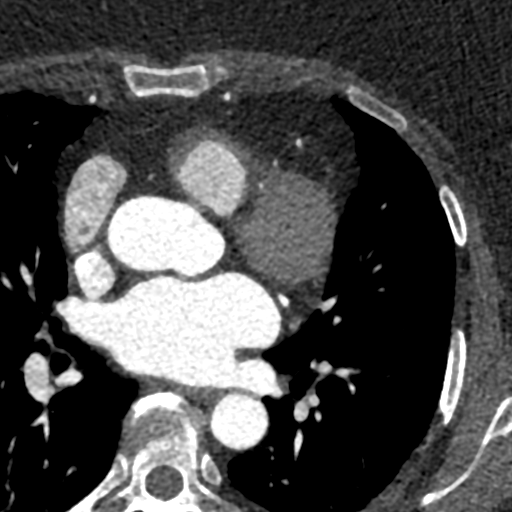

[Series 8: ts diast sharp 69 % · axial · 0.38mm/px · z∈[+1193,+1236]mm · 2 of 321 slices shown]
[im 107/321  lung]
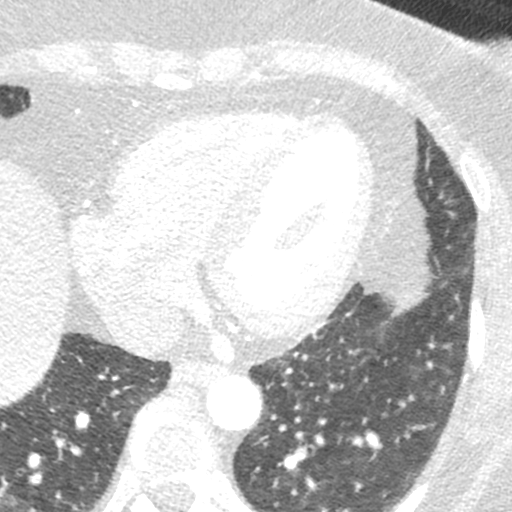
[im 214/321  lung]
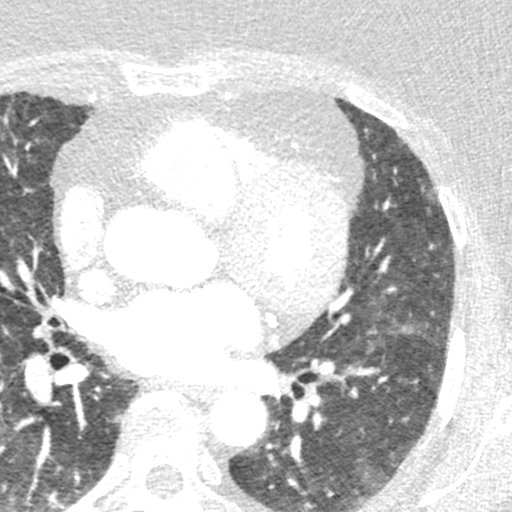

[Series 9: ts syst sharp 43 % · axial · 0.38mm/px · z∈[+1193,+1236]mm · 2 of 321 slices shown]
[im 107/321  lung]
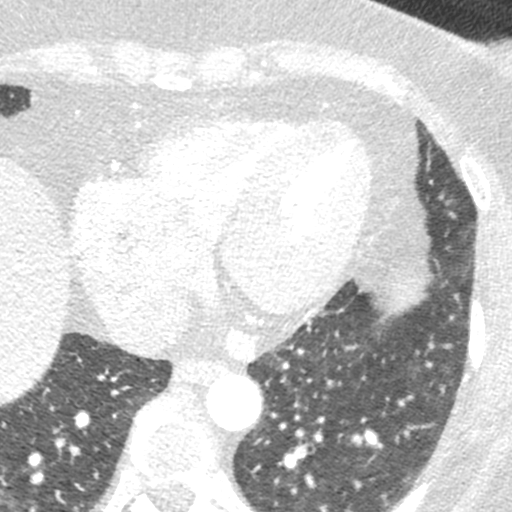
[im 214/321  lung]
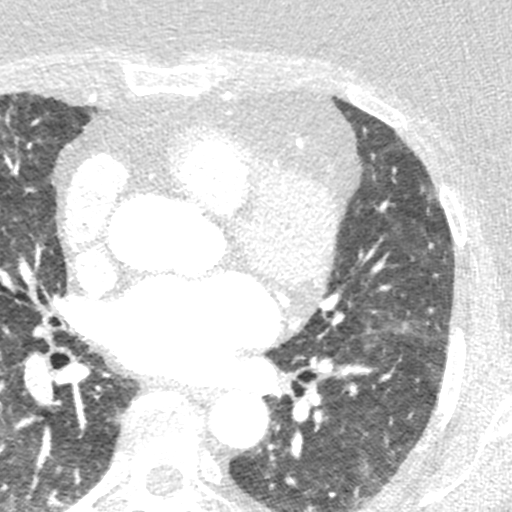

[8 of 20 positions shown; findings below may reference images not displayed]

FINDINGS: Multiple small pulmonary nodules are noted in the visualized
portions of the right lung, largest of which is in the medial aspect
of the right lower lobe measuring 8 mm. Within the visualized
portions of the thorax there is no acute consolidative airspace
disease, no pleural effusions, no pneumothorax and no
lymphadenopathy. Visualized portions of the upper abdomen are
unremarkable. There are no aggressive appearing lytic or blastic
lesions noted in the visualized portions of the skeleton.
IMPRESSION: 1. Multiple small pulmonary nodules in the right lung, similar to
the recent prior examination, measuring up to 9 x 8 mm in the right
lower lobe. Non-contrast chest CT at 3-6 months is recommended. If
the nodules are stable at time of repeat CT, then future CT at 18-24
months (from today's scan) is considered optional for low-risk
patients, but is recommended for high-risk patients. This
recommendation follows the consensus statement: Guidelines for
Management of Incidental Pulmonary Nodules Detected on CT Images:

EXAM:
Cardiac/Coronary  CT
FINDINGS: A 120 kV prospective scan was triggered in the descending thoracic
aorta at 111 HU's. Axial non-contrast 3 mm slices were carried out
through the heart. The data set was analyzed on a dedicated work
station and scored using the Agatson method. Gantry rotation speed
was 250 msecs and collimation was .6 mm. No beta blockade and 0.8 mg
of sl NTG was given. The 3D data set was reconstructed in 5%
intervals of the 67-82 % of the R-R cycle. Diastolic phases were
analyzed on a dedicated work station using MPR, MIP and VRT modes.
The patient received 80 cc of contrast.

Aorta:  Normal size.  No calcifications.  No dissection.

Aortic Valve:  Trileaflet.  No calcifications.

Coronary Arteries:  Normal coronary origin.  Right dominance.

RCA is a large dominant artery that gives rise to PDA and PLVB.
There is no plaque.

Left main is a large artery that gives rise to LAD and LCX arteries.
There is no plaque.

LAD is a large vessel that gives rise to 2 small diagonal branches.
There is no plaque.

LCX is a non-dominant artery that gives rise to one large OM1 branch
and a small OM2 branch. There is no plaque.

Other findings:

Normal pulmonary vein drainage into the left atrium.

Normal let atrial appendage without a thrombus.

Normal size of the pulmonary artery.
IMPRESSION: 1. Coronary calcium score of 0. This was 0 percentile for age and
sex matched control.

2. Normal coronary origin with right dominance.

3. No evidence of CAD.  CAD-RADS=0.

4. Consider non-atherosclerotic causes of chest pain.

Amdou Kerala

*** End of Addendum ***
EXAM:
OVER-READ INTERPRETATION  CT CHEST

The following report is an over-read performed by radiologist Dr.
Truc Arciniega [REDACTED] on 03/19/2020. This
over-read does not include interpretation of cardiac or coronary
anatomy or pathology. The coronary calcium score/coronary CTA
interpretation by the cardiologist is attached.
FINDINGS: Multiple small pulmonary nodules are noted in the visualized
portions of the right lung, largest of which is in the medial aspect
of the right lower lobe measuring 8 mm. Within the visualized
portions of the thorax there is no acute consolidative airspace
disease, no pleural effusions, no pneumothorax and no
lymphadenopathy. Visualized portions of the upper abdomen are
unremarkable. There are no aggressive appearing lytic or blastic
lesions noted in the visualized portions of the skeleton.
IMPRESSION: 1. Multiple small pulmonary nodules in the right lung, similar to
the recent prior examination, measuring up to 9 x 8 mm in the right
lower lobe. Non-contrast chest CT at 3-6 months is recommended. If
the nodules are stable at time of repeat CT, then future CT at 18-24
months (from today's scan) is considered optional for low-risk
patients, but is recommended for high-risk patients. This
recommendation follows the consensus statement: Guidelines for
Management of Incidental Pulmonary Nodules Detected on CT Images:

## 2021-07-30 DIAGNOSIS — E538 Deficiency of other specified B group vitamins: Secondary | ICD-10-CM | POA: Diagnosis not present

## 2021-07-30 DIAGNOSIS — I10 Essential (primary) hypertension: Secondary | ICD-10-CM | POA: Diagnosis not present

## 2021-07-30 DIAGNOSIS — E039 Hypothyroidism, unspecified: Secondary | ICD-10-CM | POA: Diagnosis not present

## 2021-07-30 DIAGNOSIS — E559 Vitamin D deficiency, unspecified: Secondary | ICD-10-CM | POA: Diagnosis not present

## 2021-08-20 ENCOUNTER — Telehealth: Payer: Self-pay

## 2021-08-20 NOTE — Telephone Encounter (Signed)
I have called and spoke to patient about recall colonoscopy, patient says she will call back to schedule.

## 2021-10-06 ENCOUNTER — Encounter: Payer: Self-pay | Admitting: Gastroenterology

## 2021-10-13 DIAGNOSIS — E559 Vitamin D deficiency, unspecified: Secondary | ICD-10-CM | POA: Diagnosis not present

## 2021-10-13 DIAGNOSIS — E039 Hypothyroidism, unspecified: Secondary | ICD-10-CM | POA: Diagnosis not present

## 2021-10-13 DIAGNOSIS — I10 Essential (primary) hypertension: Secondary | ICD-10-CM | POA: Diagnosis not present

## 2021-10-13 DIAGNOSIS — E538 Deficiency of other specified B group vitamins: Secondary | ICD-10-CM | POA: Diagnosis not present

## 2021-11-27 DIAGNOSIS — E669 Obesity, unspecified: Secondary | ICD-10-CM | POA: Diagnosis not present

## 2021-11-27 DIAGNOSIS — M722 Plantar fascial fibromatosis: Secondary | ICD-10-CM | POA: Diagnosis not present

## 2021-11-27 DIAGNOSIS — Z6838 Body mass index (BMI) 38.0-38.9, adult: Secondary | ICD-10-CM | POA: Diagnosis not present

## 2021-11-28 DIAGNOSIS — Z79899 Other long term (current) drug therapy: Secondary | ICD-10-CM | POA: Diagnosis not present

## 2021-11-28 DIAGNOSIS — E039 Hypothyroidism, unspecified: Secondary | ICD-10-CM | POA: Diagnosis not present

## 2022-01-06 ENCOUNTER — Ambulatory Visit: Payer: BC Managed Care – PPO | Admitting: Podiatry

## 2022-01-14 ENCOUNTER — Ambulatory Visit (INDEPENDENT_AMBULATORY_CARE_PROVIDER_SITE_OTHER): Payer: Self-pay | Admitting: Podiatry

## 2022-01-14 DIAGNOSIS — Z91199 Patient's noncompliance with other medical treatment and regimen due to unspecified reason: Secondary | ICD-10-CM

## 2022-01-14 NOTE — Progress Notes (Signed)
No show

## 2022-01-19 DIAGNOSIS — E039 Hypothyroidism, unspecified: Secondary | ICD-10-CM | POA: Diagnosis not present

## 2022-01-19 DIAGNOSIS — I10 Essential (primary) hypertension: Secondary | ICD-10-CM | POA: Diagnosis not present

## 2022-01-19 DIAGNOSIS — E559 Vitamin D deficiency, unspecified: Secondary | ICD-10-CM | POA: Diagnosis not present

## 2022-01-19 DIAGNOSIS — E538 Deficiency of other specified B group vitamins: Secondary | ICD-10-CM | POA: Diagnosis not present

## 2022-04-22 DIAGNOSIS — E538 Deficiency of other specified B group vitamins: Secondary | ICD-10-CM | POA: Diagnosis not present

## 2022-04-22 DIAGNOSIS — E559 Vitamin D deficiency, unspecified: Secondary | ICD-10-CM | POA: Diagnosis not present

## 2022-04-22 DIAGNOSIS — I10 Essential (primary) hypertension: Secondary | ICD-10-CM | POA: Diagnosis not present

## 2022-04-22 DIAGNOSIS — E039 Hypothyroidism, unspecified: Secondary | ICD-10-CM | POA: Diagnosis not present

## 2022-04-23 DIAGNOSIS — E039 Hypothyroidism, unspecified: Secondary | ICD-10-CM | POA: Diagnosis not present

## 2022-04-23 DIAGNOSIS — E559 Vitamin D deficiency, unspecified: Secondary | ICD-10-CM | POA: Diagnosis not present

## 2022-04-23 DIAGNOSIS — Z79899 Other long term (current) drug therapy: Secondary | ICD-10-CM | POA: Diagnosis not present

## 2022-04-29 DIAGNOSIS — Z6836 Body mass index (BMI) 36.0-36.9, adult: Secondary | ICD-10-CM | POA: Diagnosis not present

## 2022-04-29 DIAGNOSIS — N390 Urinary tract infection, site not specified: Secondary | ICD-10-CM | POA: Diagnosis not present

## 2022-07-27 DIAGNOSIS — I10 Essential (primary) hypertension: Secondary | ICD-10-CM | POA: Diagnosis not present

## 2022-07-27 DIAGNOSIS — E559 Vitamin D deficiency, unspecified: Secondary | ICD-10-CM | POA: Diagnosis not present

## 2022-07-27 DIAGNOSIS — E039 Hypothyroidism, unspecified: Secondary | ICD-10-CM | POA: Diagnosis not present

## 2022-07-27 DIAGNOSIS — E538 Deficiency of other specified B group vitamins: Secondary | ICD-10-CM | POA: Diagnosis not present

## 2022-11-02 DIAGNOSIS — E559 Vitamin D deficiency, unspecified: Secondary | ICD-10-CM | POA: Diagnosis not present

## 2022-11-02 DIAGNOSIS — E538 Deficiency of other specified B group vitamins: Secondary | ICD-10-CM | POA: Diagnosis not present

## 2022-11-02 DIAGNOSIS — I10 Essential (primary) hypertension: Secondary | ICD-10-CM | POA: Diagnosis not present

## 2022-11-02 DIAGNOSIS — E039 Hypothyroidism, unspecified: Secondary | ICD-10-CM | POA: Diagnosis not present

## 2022-11-05 DIAGNOSIS — Z79899 Other long term (current) drug therapy: Secondary | ICD-10-CM | POA: Diagnosis not present

## 2022-11-05 DIAGNOSIS — E559 Vitamin D deficiency, unspecified: Secondary | ICD-10-CM | POA: Diagnosis not present

## 2022-11-05 DIAGNOSIS — E538 Deficiency of other specified B group vitamins: Secondary | ICD-10-CM | POA: Diagnosis not present

## 2023-02-01 DIAGNOSIS — E039 Hypothyroidism, unspecified: Secondary | ICD-10-CM | POA: Diagnosis not present

## 2023-02-01 DIAGNOSIS — E559 Vitamin D deficiency, unspecified: Secondary | ICD-10-CM | POA: Diagnosis not present

## 2023-02-01 DIAGNOSIS — E538 Deficiency of other specified B group vitamins: Secondary | ICD-10-CM | POA: Diagnosis not present

## 2023-02-01 DIAGNOSIS — I10 Essential (primary) hypertension: Secondary | ICD-10-CM | POA: Diagnosis not present

## 2023-02-02 DIAGNOSIS — Z79899 Other long term (current) drug therapy: Secondary | ICD-10-CM | POA: Diagnosis not present

## 2023-05-25 DIAGNOSIS — I10 Essential (primary) hypertension: Secondary | ICD-10-CM | POA: Diagnosis not present

## 2023-05-25 DIAGNOSIS — Z79899 Other long term (current) drug therapy: Secondary | ICD-10-CM | POA: Diagnosis not present

## 2023-05-25 DIAGNOSIS — E538 Deficiency of other specified B group vitamins: Secondary | ICD-10-CM | POA: Diagnosis not present

## 2023-05-25 DIAGNOSIS — E039 Hypothyroidism, unspecified: Secondary | ICD-10-CM | POA: Diagnosis not present

## 2023-05-25 DIAGNOSIS — E559 Vitamin D deficiency, unspecified: Secondary | ICD-10-CM | POA: Diagnosis not present

## 2023-09-08 DIAGNOSIS — G43909 Migraine, unspecified, not intractable, without status migrainosus: Secondary | ICD-10-CM | POA: Diagnosis not present

## 2023-09-08 DIAGNOSIS — M25562 Pain in left knee: Secondary | ICD-10-CM | POA: Diagnosis not present

## 2023-09-08 DIAGNOSIS — E538 Deficiency of other specified B group vitamins: Secondary | ICD-10-CM | POA: Diagnosis not present

## 2023-09-08 DIAGNOSIS — I10 Essential (primary) hypertension: Secondary | ICD-10-CM | POA: Diagnosis not present

## 2023-09-16 DIAGNOSIS — Z79899 Other long term (current) drug therapy: Secondary | ICD-10-CM | POA: Diagnosis not present

## 2023-09-16 DIAGNOSIS — E559 Vitamin D deficiency, unspecified: Secondary | ICD-10-CM | POA: Diagnosis not present

## 2023-09-16 DIAGNOSIS — M1712 Unilateral primary osteoarthritis, left knee: Secondary | ICD-10-CM | POA: Diagnosis not present

## 2023-09-16 DIAGNOSIS — E538 Deficiency of other specified B group vitamins: Secondary | ICD-10-CM | POA: Diagnosis not present

## 2023-09-16 DIAGNOSIS — R5383 Other fatigue: Secondary | ICD-10-CM | POA: Diagnosis not present

## 2023-09-16 DIAGNOSIS — E039 Hypothyroidism, unspecified: Secondary | ICD-10-CM | POA: Diagnosis not present

## 2023-09-16 DIAGNOSIS — Z6841 Body Mass Index (BMI) 40.0 and over, adult: Secondary | ICD-10-CM | POA: Diagnosis not present

## 2023-10-07 DIAGNOSIS — Z0000 Encounter for general adult medical examination without abnormal findings: Secondary | ICD-10-CM | POA: Diagnosis not present

## 2023-10-07 DIAGNOSIS — E559 Vitamin D deficiency, unspecified: Secondary | ICD-10-CM | POA: Diagnosis not present

## 2023-10-07 DIAGNOSIS — M545 Low back pain, unspecified: Secondary | ICD-10-CM | POA: Diagnosis not present

## 2023-10-07 DIAGNOSIS — Z23 Encounter for immunization: Secondary | ICD-10-CM | POA: Diagnosis not present

## 2023-10-07 DIAGNOSIS — E538 Deficiency of other specified B group vitamins: Secondary | ICD-10-CM | POA: Diagnosis not present

## 2023-11-02 DIAGNOSIS — Z6841 Body Mass Index (BMI) 40.0 and over, adult: Secondary | ICD-10-CM | POA: Diagnosis not present

## 2023-11-02 DIAGNOSIS — Z01419 Encounter for gynecological examination (general) (routine) without abnormal findings: Secondary | ICD-10-CM | POA: Diagnosis not present

## 2023-11-02 DIAGNOSIS — N950 Postmenopausal bleeding: Secondary | ICD-10-CM | POA: Diagnosis not present

## 2023-11-02 DIAGNOSIS — Z124 Encounter for screening for malignant neoplasm of cervix: Secondary | ICD-10-CM | POA: Diagnosis not present

## 2023-11-02 DIAGNOSIS — R319 Hematuria, unspecified: Secondary | ICD-10-CM | POA: Diagnosis not present

## 2023-11-22 DIAGNOSIS — R809 Proteinuria, unspecified: Secondary | ICD-10-CM | POA: Diagnosis not present

## 2023-11-22 DIAGNOSIS — N950 Postmenopausal bleeding: Secondary | ICD-10-CM | POA: Diagnosis not present

## 2023-11-22 DIAGNOSIS — N390 Urinary tract infection, site not specified: Secondary | ICD-10-CM | POA: Diagnosis not present

## 2023-12-01 DIAGNOSIS — E039 Hypothyroidism, unspecified: Secondary | ICD-10-CM | POA: Diagnosis not present

## 2023-12-01 DIAGNOSIS — E559 Vitamin D deficiency, unspecified: Secondary | ICD-10-CM | POA: Diagnosis not present

## 2023-12-01 DIAGNOSIS — I10 Essential (primary) hypertension: Secondary | ICD-10-CM | POA: Diagnosis not present

## 2023-12-01 DIAGNOSIS — E538 Deficiency of other specified B group vitamins: Secondary | ICD-10-CM | POA: Diagnosis not present

## 2023-12-28 DIAGNOSIS — M1712 Unilateral primary osteoarthritis, left knee: Secondary | ICD-10-CM | POA: Diagnosis not present

## 2024-01-13 DIAGNOSIS — M1712 Unilateral primary osteoarthritis, left knee: Secondary | ICD-10-CM | POA: Diagnosis not present

## 2024-01-14 DIAGNOSIS — S83012A Lateral subluxation of left patella, initial encounter: Secondary | ICD-10-CM | POA: Diagnosis not present

## 2024-01-15 DIAGNOSIS — S83012A Lateral subluxation of left patella, initial encounter: Secondary | ICD-10-CM | POA: Diagnosis not present

## 2024-02-11 DIAGNOSIS — M1712 Unilateral primary osteoarthritis, left knee: Secondary | ICD-10-CM | POA: Diagnosis not present

## 2024-03-01 DIAGNOSIS — Z1231 Encounter for screening mammogram for malignant neoplasm of breast: Secondary | ICD-10-CM | POA: Diagnosis not present

## 2024-03-01 DIAGNOSIS — Z780 Asymptomatic menopausal state: Secondary | ICD-10-CM | POA: Diagnosis not present

## 2024-03-01 DIAGNOSIS — Z13820 Encounter for screening for osteoporosis: Secondary | ICD-10-CM | POA: Diagnosis not present

## 2024-03-01 DIAGNOSIS — N959 Unspecified menopausal and perimenopausal disorder: Secondary | ICD-10-CM | POA: Diagnosis not present

## 2024-03-28 DIAGNOSIS — I10 Essential (primary) hypertension: Secondary | ICD-10-CM | POA: Diagnosis not present

## 2024-03-28 DIAGNOSIS — E559 Vitamin D deficiency, unspecified: Secondary | ICD-10-CM | POA: Diagnosis not present

## 2024-03-28 DIAGNOSIS — Z79899 Other long term (current) drug therapy: Secondary | ICD-10-CM | POA: Diagnosis not present

## 2024-03-28 DIAGNOSIS — R739 Hyperglycemia, unspecified: Secondary | ICD-10-CM | POA: Diagnosis not present

## 2024-03-28 DIAGNOSIS — E538 Deficiency of other specified B group vitamins: Secondary | ICD-10-CM | POA: Diagnosis not present

## 2024-03-28 DIAGNOSIS — J45909 Unspecified asthma, uncomplicated: Secondary | ICD-10-CM | POA: Diagnosis not present

## 2024-03-28 DIAGNOSIS — N950 Postmenopausal bleeding: Secondary | ICD-10-CM | POA: Diagnosis not present

## 2024-07-03 DIAGNOSIS — E039 Hypothyroidism, unspecified: Secondary | ICD-10-CM | POA: Diagnosis not present

## 2024-07-03 DIAGNOSIS — Z8669 Personal history of other diseases of the nervous system and sense organs: Secondary | ICD-10-CM | POA: Diagnosis not present

## 2024-07-03 DIAGNOSIS — I10 Essential (primary) hypertension: Secondary | ICD-10-CM | POA: Diagnosis not present

## 2024-07-03 DIAGNOSIS — E559 Vitamin D deficiency, unspecified: Secondary | ICD-10-CM | POA: Diagnosis not present

## 2024-07-06 DIAGNOSIS — M1712 Unilateral primary osteoarthritis, left knee: Secondary | ICD-10-CM | POA: Diagnosis not present

## 2024-07-06 DIAGNOSIS — Z23 Encounter for immunization: Secondary | ICD-10-CM | POA: Diagnosis not present

## 2024-07-06 DIAGNOSIS — Z6841 Body Mass Index (BMI) 40.0 and over, adult: Secondary | ICD-10-CM | POA: Diagnosis not present

## 2024-07-06 DIAGNOSIS — Z800 Family history of malignant neoplasm of digestive organs: Secondary | ICD-10-CM | POA: Diagnosis not present
